# Patient Record
Sex: Female | Born: 1945 | Race: White | Hispanic: No | Smoking: Never smoker
Health system: Southern US, Community
[De-identification: ages and names within clinical notes are randomized; demographics above are authoritative.]

## PROBLEM LIST (undated history)

## (undated) DIAGNOSIS — I10 Essential (primary) hypertension: Secondary | ICD-10-CM

## (undated) HISTORY — PX: ABDOMINAL HYSTERECTOMY: SHX81

## (undated) HISTORY — DX: Essential (primary) hypertension: I10

## (undated) HISTORY — PX: HERNIA REPAIR: SHX51

## (undated) HISTORY — PX: PLEURAL SCARIFICATION: SHX748

---

## 2002-04-06 ENCOUNTER — Encounter: Admission: RE | Admit: 2002-04-06 | Discharge: 2002-04-06 | Payer: Self-pay | Admitting: Family Medicine

## 2002-04-06 ENCOUNTER — Encounter: Payer: Self-pay | Admitting: Family Medicine

## 2009-05-18 ENCOUNTER — Emergency Department (HOSPITAL_COMMUNITY): Admission: EM | Admit: 2009-05-18 | Discharge: 2009-05-18 | Payer: Self-pay | Admitting: Emergency Medicine

## 2009-05-19 ENCOUNTER — Inpatient Hospital Stay (HOSPITAL_COMMUNITY): Admission: EM | Admit: 2009-05-19 | Discharge: 2009-05-26 | Payer: Self-pay | Admitting: Emergency Medicine

## 2009-05-19 ENCOUNTER — Encounter (INDEPENDENT_AMBULATORY_CARE_PROVIDER_SITE_OTHER): Payer: Self-pay | Admitting: *Deleted

## 2009-05-19 IMAGING — CT CT CHEST W/ CM
1 of 2 series · 14 of 31 positions shown, 18 images · IV contrast (agent unspecified)
Comparison: Plain films earlier today

 CT CHEST

[DATE] - DUPLICATE COPY for exam association in [facility] – No change from original report.
CLINICAL DATA: Back pain with nausea and vomiting, elevated white
 count and elevated blood pressure.

 CT CHEST AND ABDOMEN WITH CONTRAST
TECHNIQUE: Multidetector CT imaging of the chest and abdomen was
 performed following the standard protocol during bolus
 administration of intravenous contrast.
 Contrast: [IN], 100 ml

[Series 2: ca with st · axial · 0.58mm/px · z∈[+834,+1214]mm · 14 of 86 slices shown, 18 images]
[im 5/86  mediastinal]
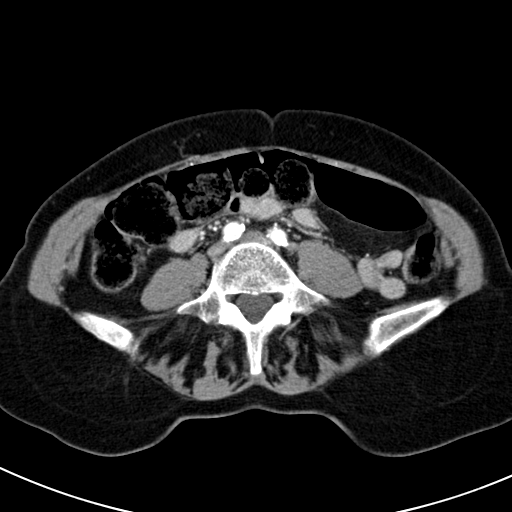
[im 5/86  lung]
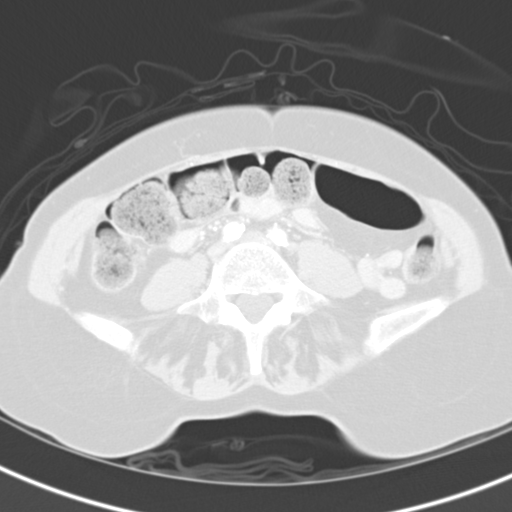
[im 14/86  lung]
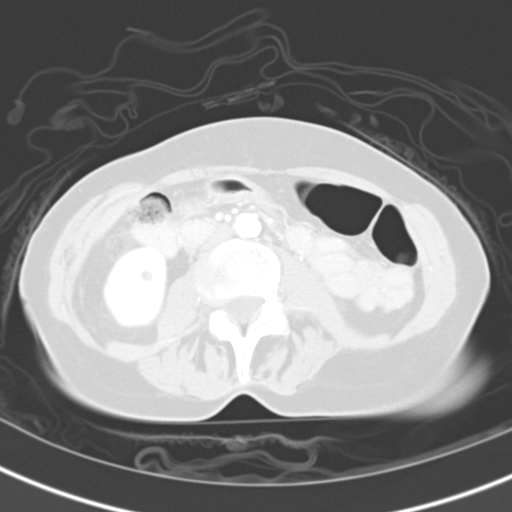
[im 18/86  lung]
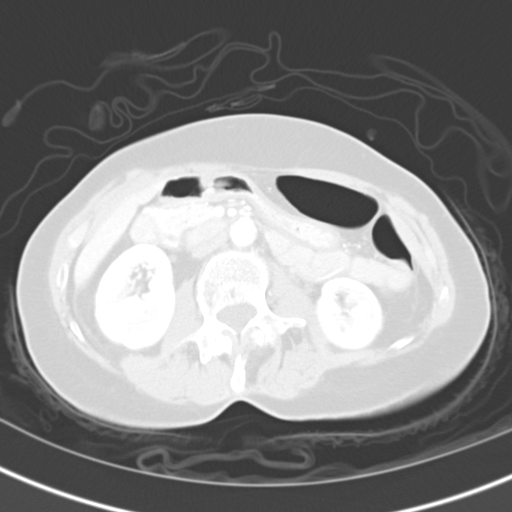
[im 27/86  lung]
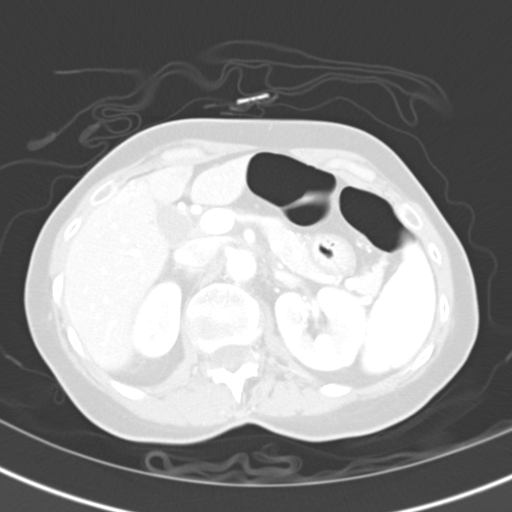
[im 29/86  mediastinal]
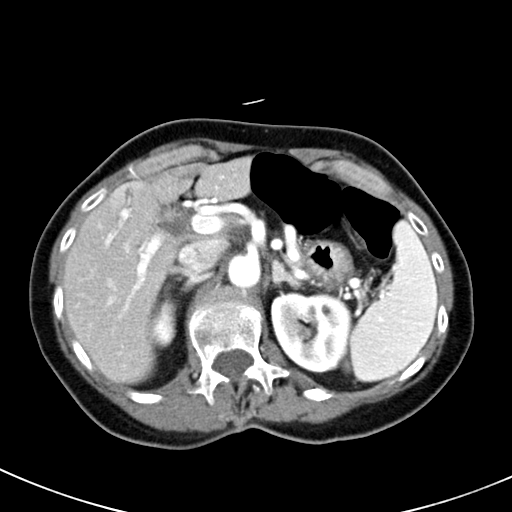
[im 29/86  lung]
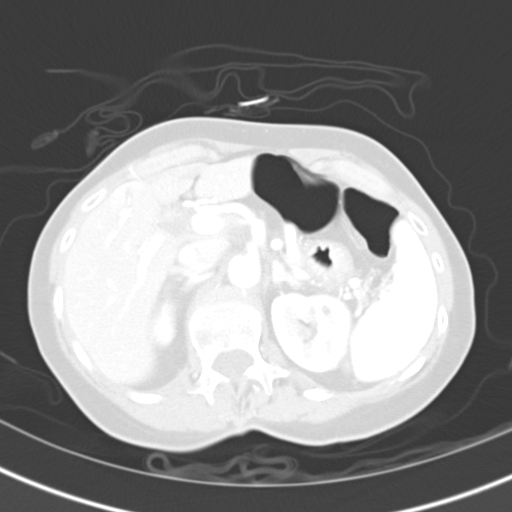
[im 36/86  lung]
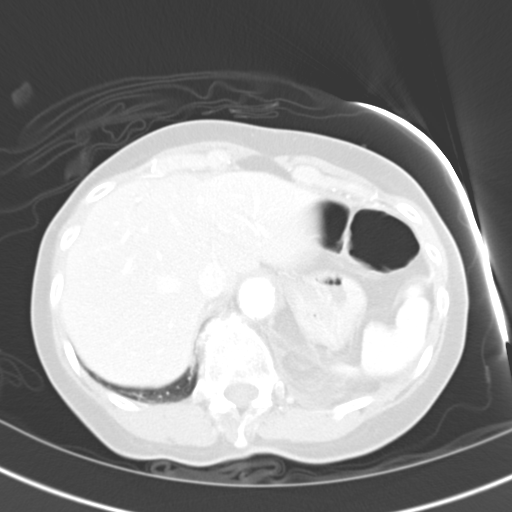
[im 41/86  lung]
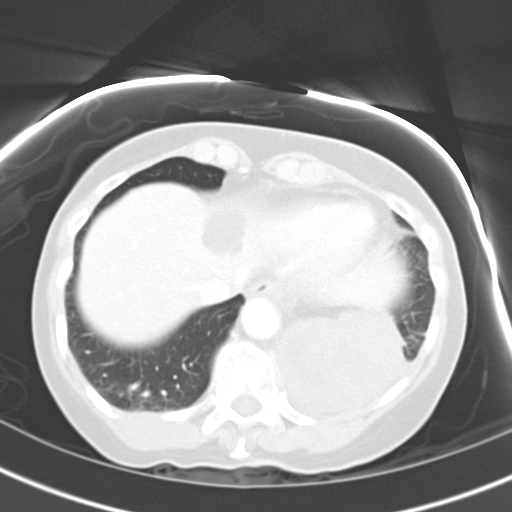
[im 45/86  lung]
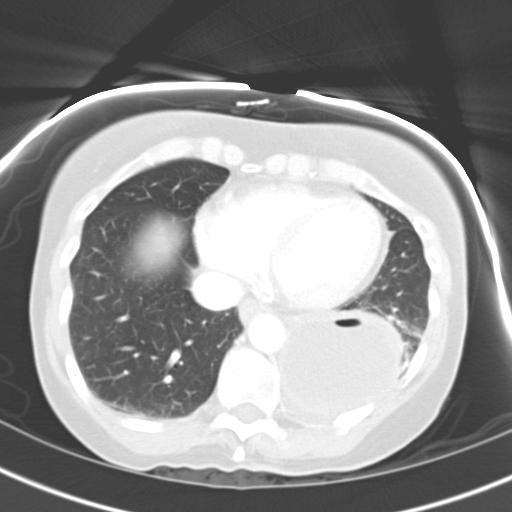
[im 50/86  mediastinal]
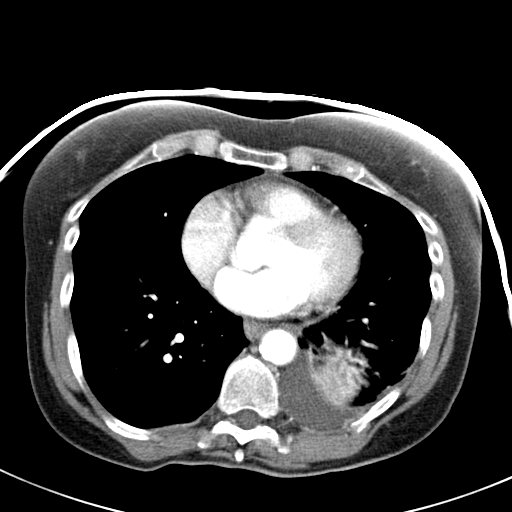
[im 50/86  lung]
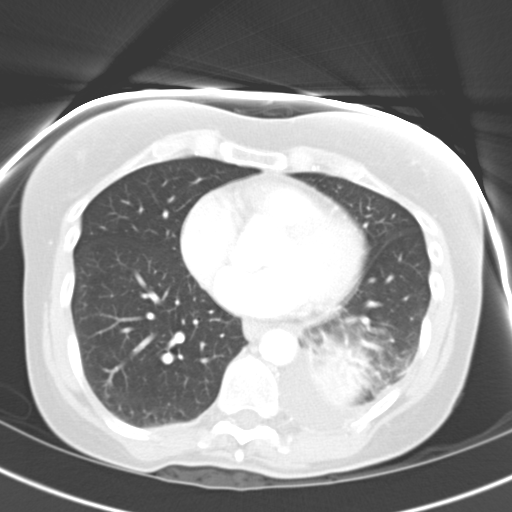
[im 57/86  lung]
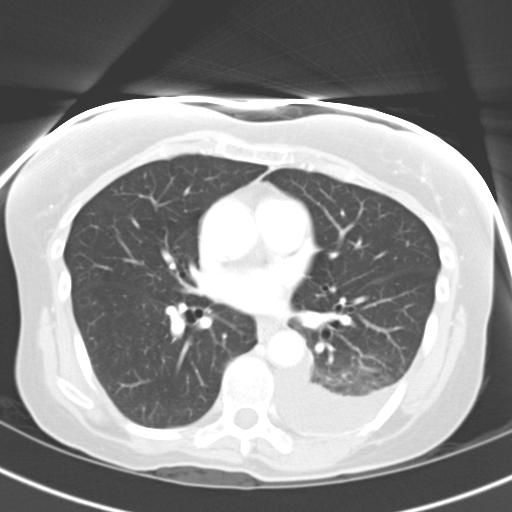
[im 59/86  lung]
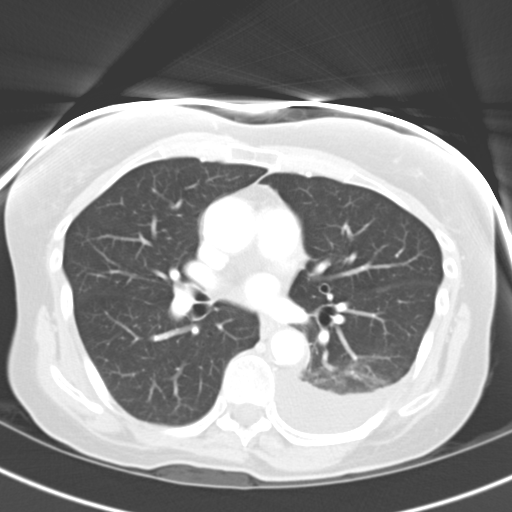
[im 68/86  lung]
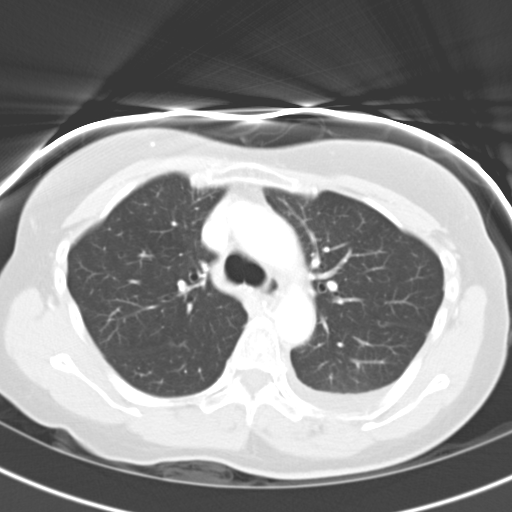
[im 72/86  mediastinal]
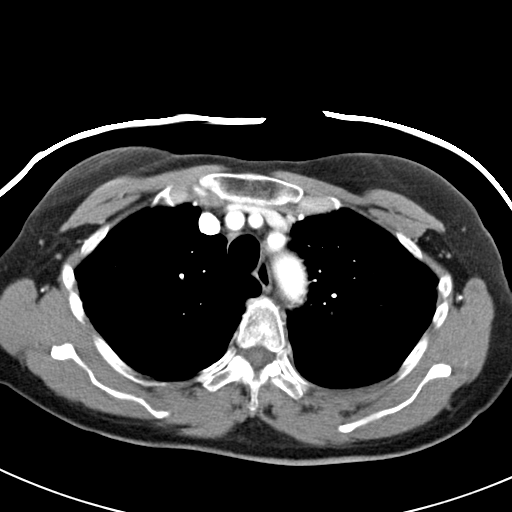
[im 72/86  lung]
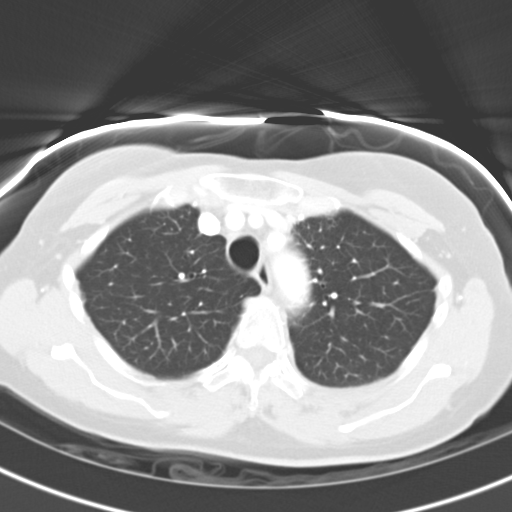
[im 81/86  lung]
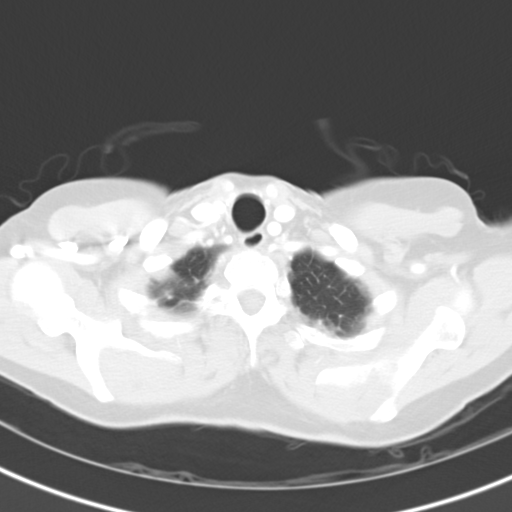

[14 of 31 positions shown; findings below may reference images not displayed]

FINDINGS: There is an the ovoid fluid and air containing structure
 in the chest which communicates through a small defect in the
 diaphragm with the infradiaphragmatic stomach. Cross-sectional
 measurements are 79 x 59 x 65 mm. I believe this is a
 diaphragmatic hernia containing stomach which is partially
 incarcerated. There is some thickening of this structure
 inferiorly, which could represent normal gastric wall thickening or
 could represent some edema related to incarceration. There is an
 associated left pleural effusion. There is no definite
 pneumoperitoneum or pneumomediastinum. I see no pneumatosis.

 Chest is otherwise clear. The vascular structures are within
 normal limits. There is some compressive atelectasis at the left
 base but no infiltrates, nodules, or failure. There is no
 pericardial effusion or right pleural effusion. No adenopathy is
 seen. Bony thorax is unremarkable except for a small calcified
 thoracic disc protrusion in the mid-thoracic region.
IMPRESSION: Suspect diaphragmatic hernia containing the fundal stomach which
 has herniated into the left chest. There is associated left
 pleural effusion which may be sympathetic.

 CT ABDOMEN
FINDINGS: The fluid filled structure in the left lower thorax can
 be seen to communicate through a small defect in the diaphragm with
 the stomach below. This is best seen on coronal images 47-50. The
 gastroesophageal junction appears to remain below the diaphragm.
 The herniated stomach appears to represent the fundus, and may be
 partially incarcerated. There is stranding of the fat medial to
 the spleen suggesting an inflammatory reaction. I do not see any
 esophageal tear or perforation. There is no pneumoperitoneum or
 pneumomediastinum.

 The remainder of the abdominal organs appear unremarkable. The
 liver is normal except for a medial left lobe cyst roughly
 spherical measuring 2.5 cm. The spleen is normal. No pancreatic,
 renal, or adrenal abnormalities are seen. Vascular structures are
 patent with new mild atherosclerotic calcification of the aorta.
 Mild degenerative change lower thoracic lumbar spine. No small or
 large bowel abnormality can be seen.
IMPRESSION: Left diaphragmatic hernia with suspected partially incarcerated
 gastric fundus.

## 2009-05-19 IMAGING — CR DG ABDOMEN DECUB ONLY 1V
2 series · 2 of 2 positions shown · non-contrast
Comparison: Plain films earlier today.

CLINICAL DATA: Vomiting with elevated blood pressure.  Abnormal
acute abdomen and chest series.

ABDOMEN - 1 VIEW DECUBITUS

[w abdomen decub (1 of 2)]
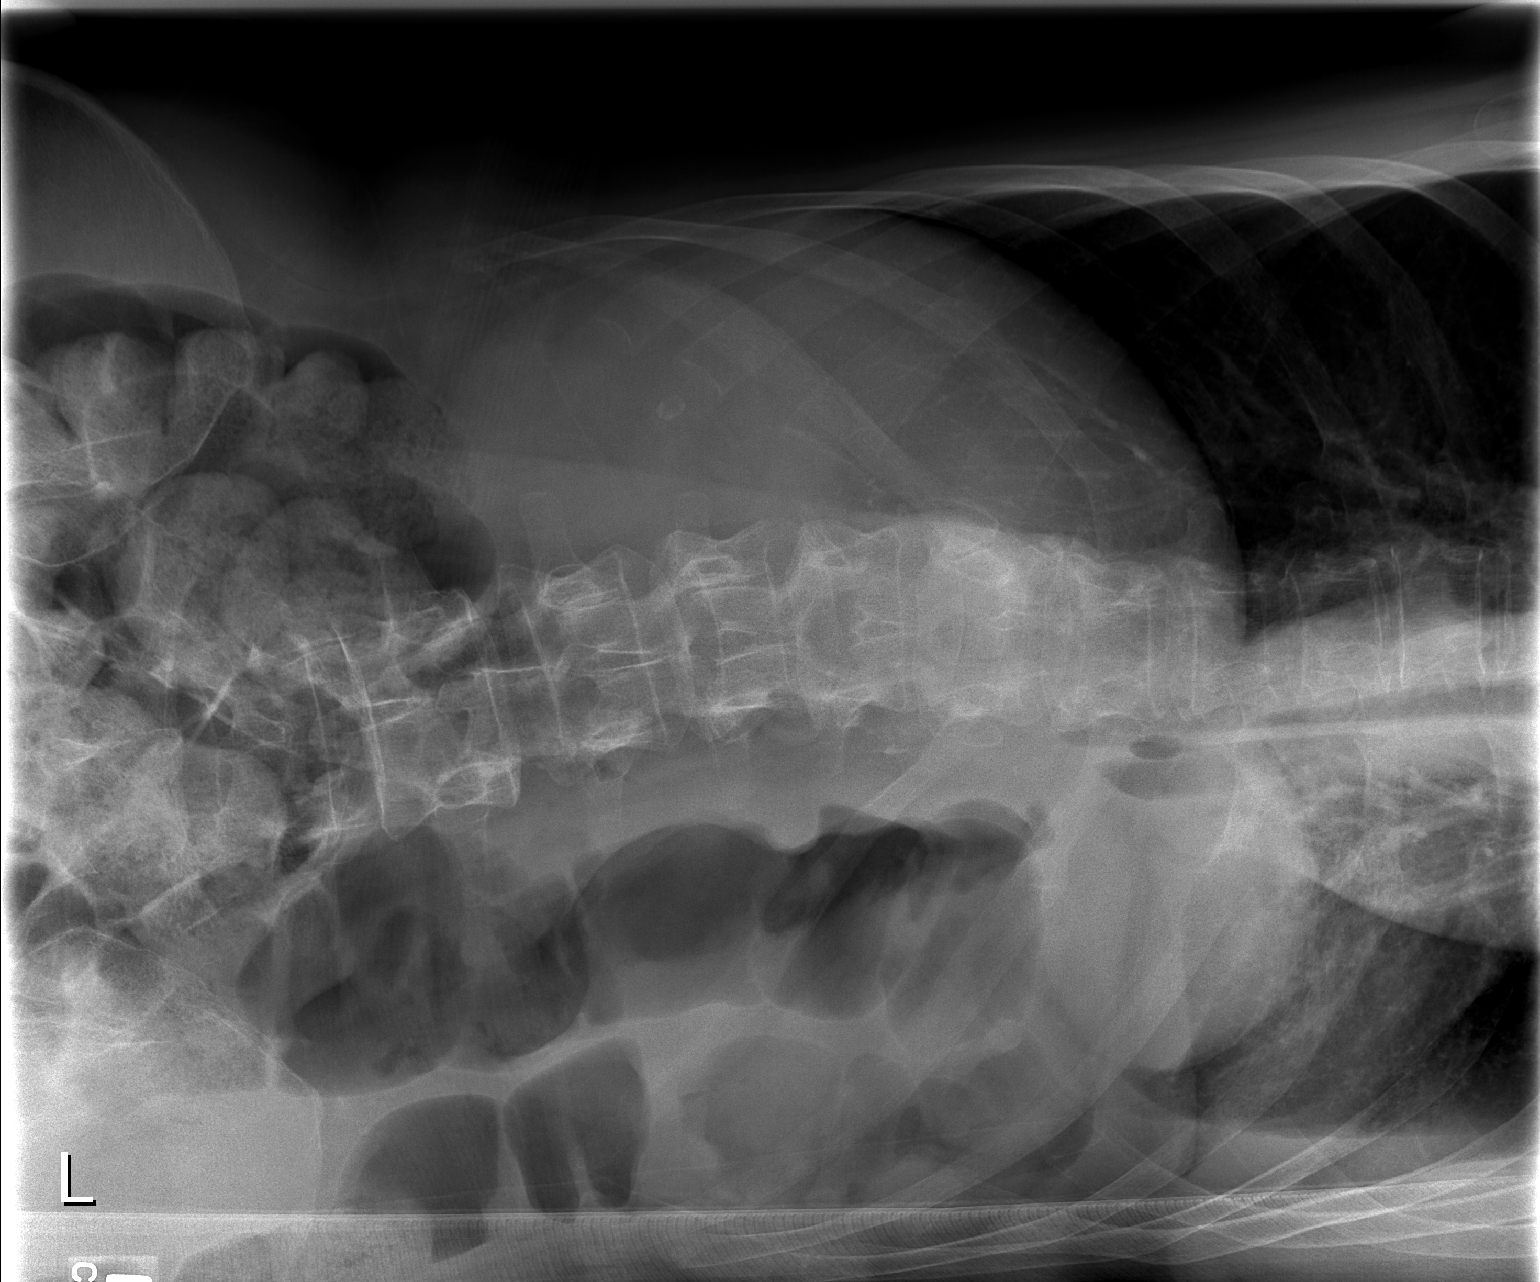

[w abdomen decub (2 of 2)]
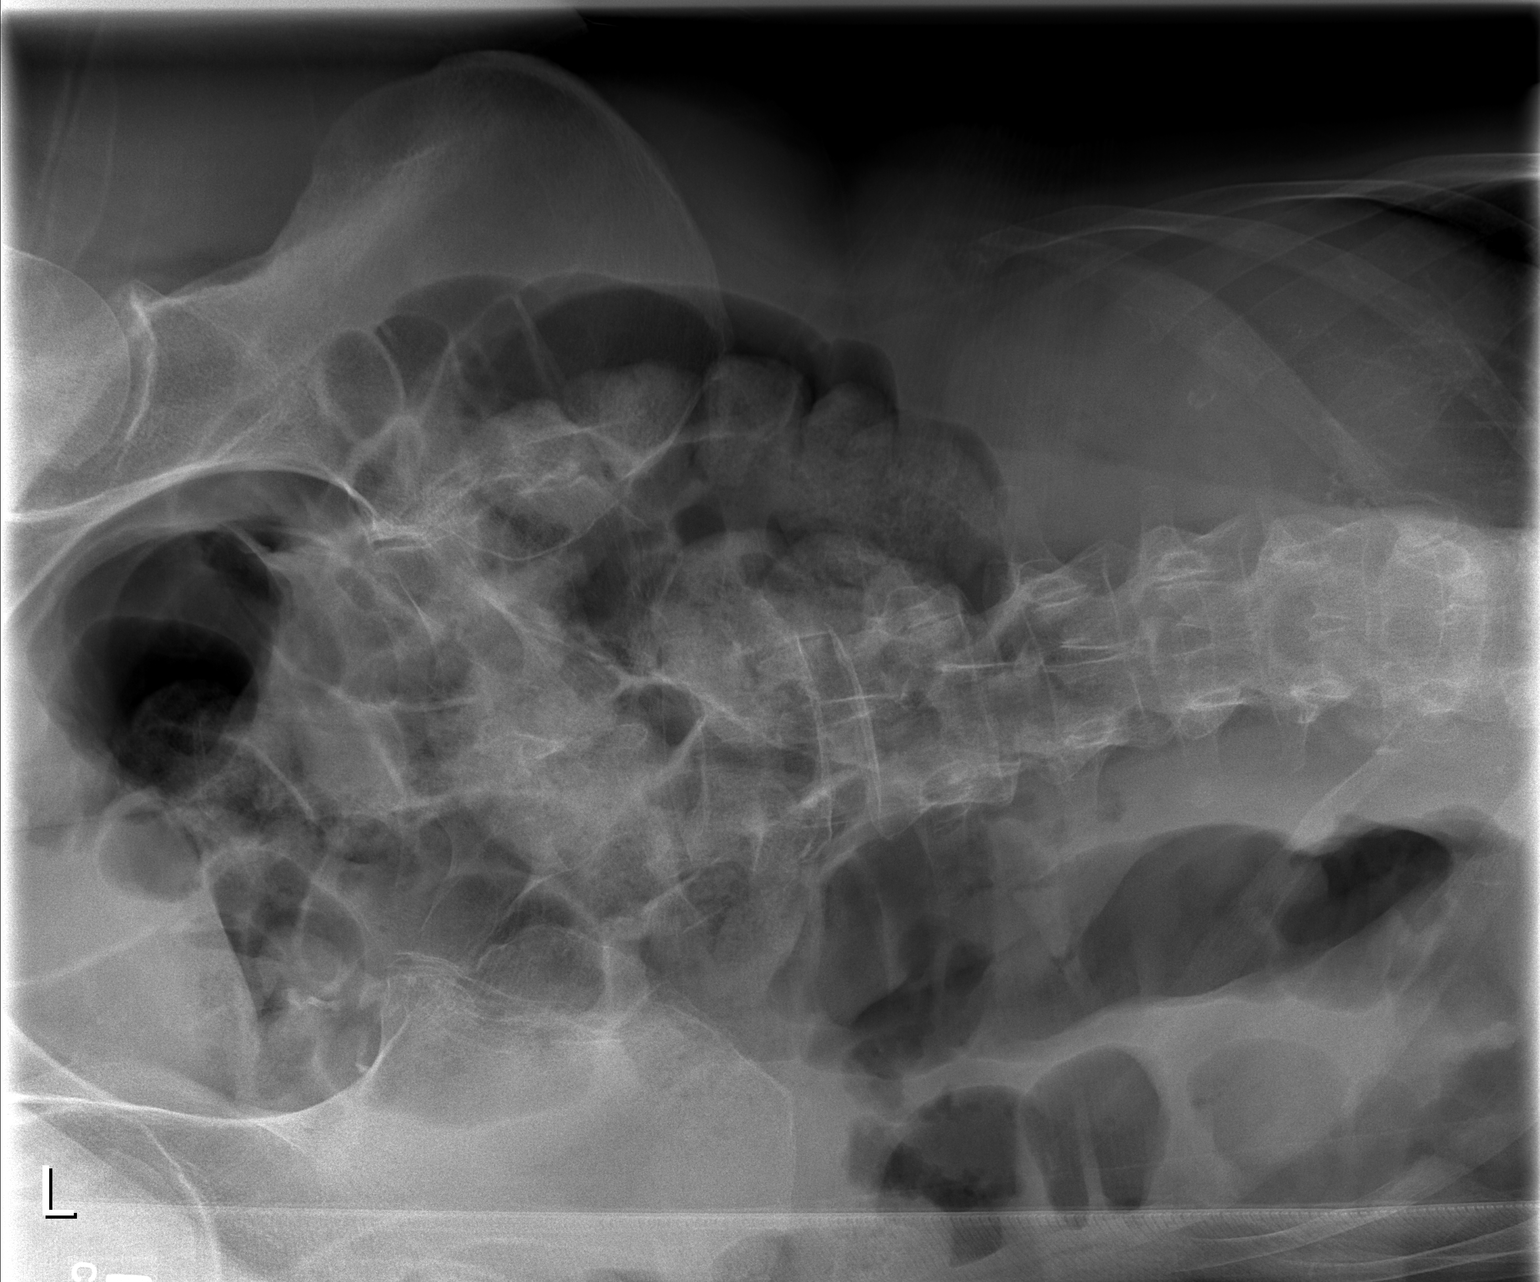

[2 of 2 positions shown; findings below may reference images not displayed]

FINDINGS: With the patient in the left lateral decubitus position,
a sizable left pleural effusion becomes evident.  There is mild
atelectasis at the left base.  The linear air collection under the
left hemidiaphragm now extends medially and is trapped tip near the
gastroesophageal junction. It is not clear whether this represents
pneumoperitoneum or pneumomediastinum.  With the history of
vomiting, concern is raised for distal esophageal tear.  CT of the
chest and abdomen is recommended for further evaluation.  Findings
discussed with Dr. HASKELL.
IMPRESSION: Concern raised for distal esophageal tear related to vomiting.  CT
chest and abdomen recommended for further evaluation.

## 2009-05-19 IMAGING — CR DG ABDOMEN ACUTE W/ 1V CHEST
3 series · 3 of 3 positions shown · non-contrast
Comparison: None

CLINICAL DATA: Nausea and vomiting.  Weakness.  Hypertension.

ACUTE ABDOMEN SERIES (ABDOMEN 2 VIEW & CHEST 1 VIEW)

[w chest pa]
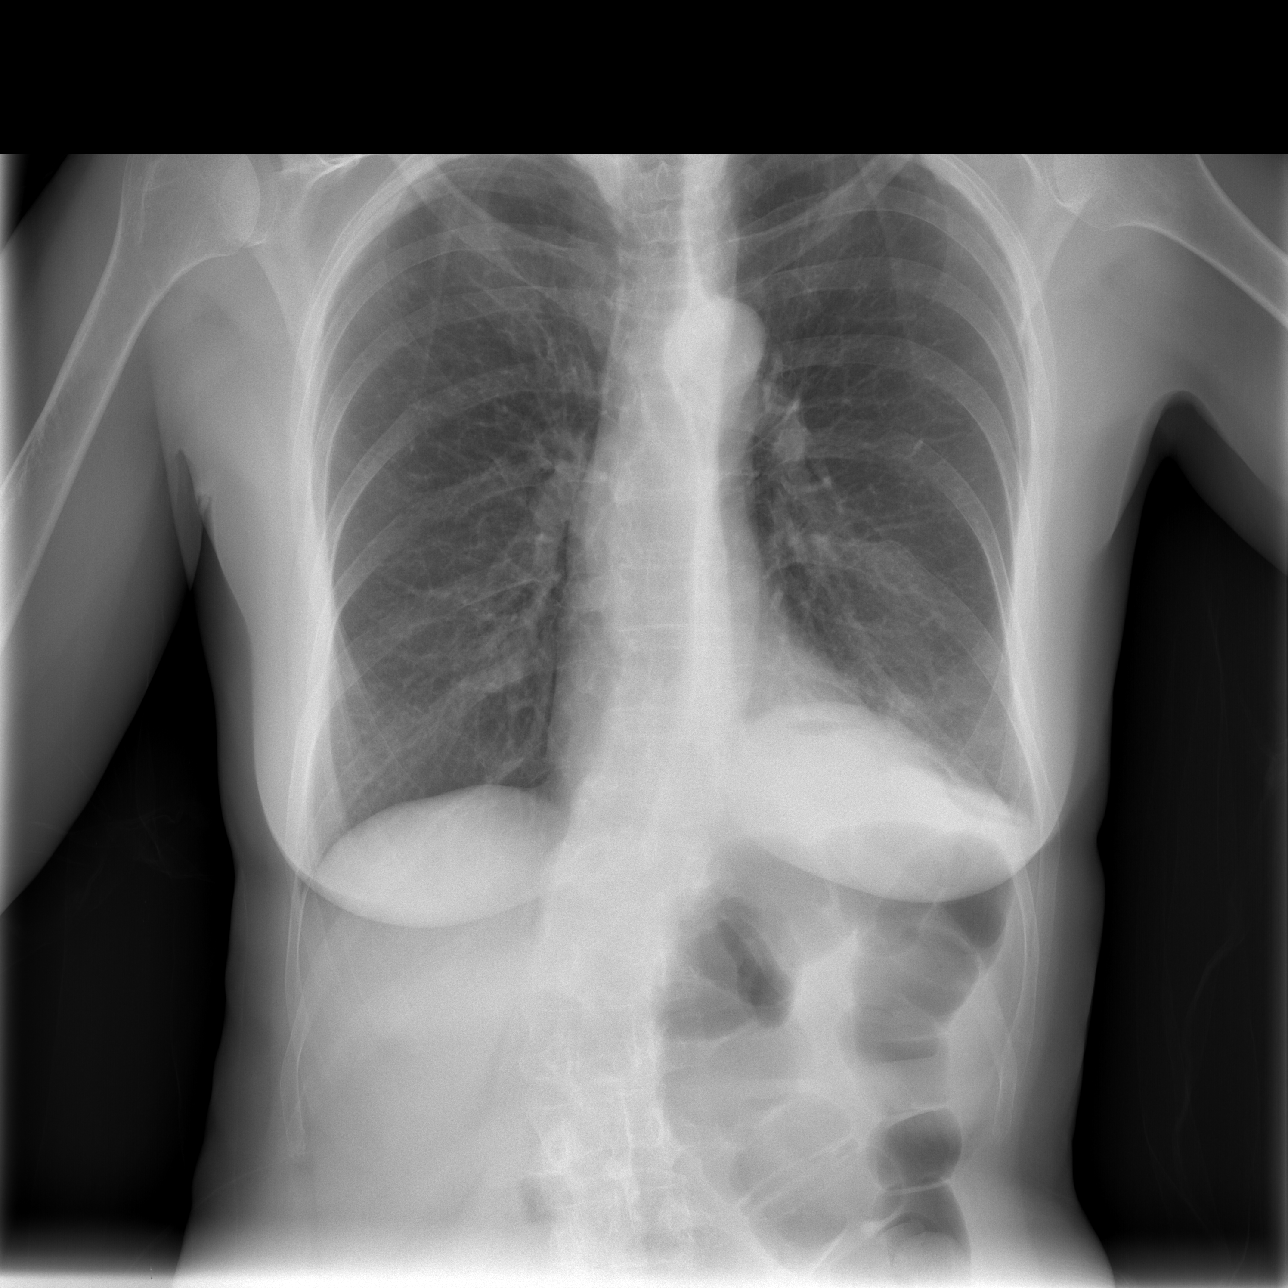

[w abdomen upright *]
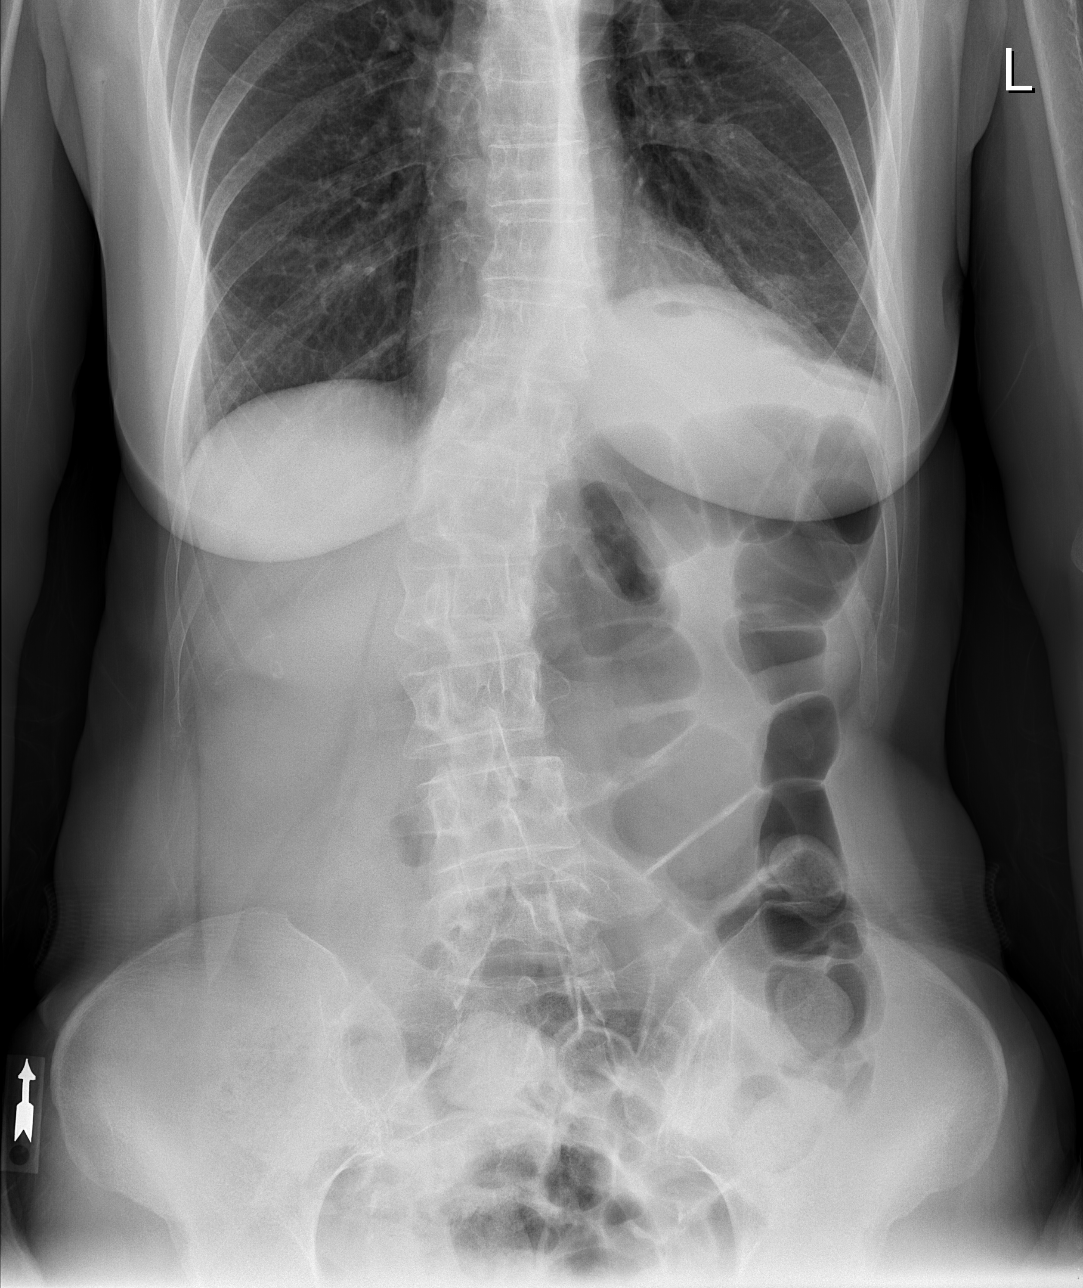

[t abdomen supine]
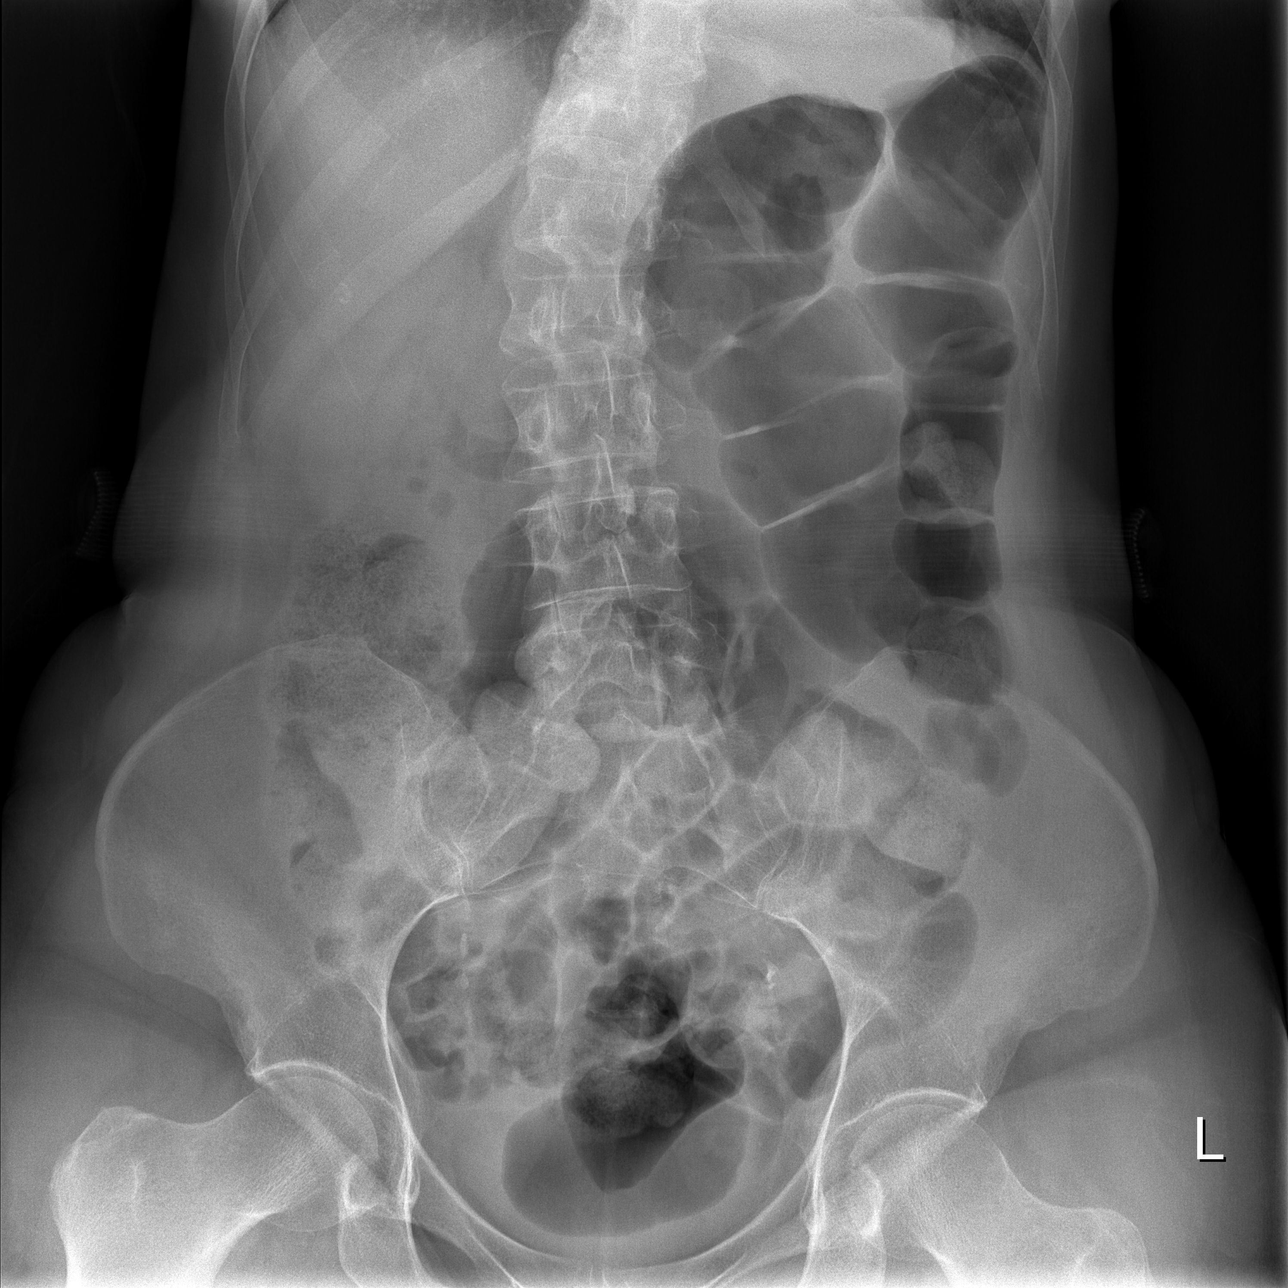

[3 of 3 positions shown; findings below may reference images not displayed]

FINDINGS: There is a linear air collection under the slightly
elevated left hemidiaphragm.  This is probably in the fundus of the
stomach but I recommend a left lateral decubitus view of the
abdomen to exclude free air.

There is slight irregular apical pleural thickening on the left.

The lungs are otherwise clear and heart size and vascularity are
normal.

The bowel gas pattern is normal.  There is a thoracolumbar
scoliosis.
IMPRESSION: 1.  Linear air collection under the left hemidiaphragm.  I
recommend left lateral decubitus view to exclude free air in the
abdomen.
2.  No other significant findings.

## 2009-05-19 IMAGING — CT CT HEAD W/O CM
1 series · 16 of 30 positions shown, 20 images · non-contrast
Comparison: None

CLINICAL DATA: Vomiting.  Weakness.  Hypertension.

CT HEAD WITHOUT CONTRAST
TECHNIQUE: Contiguous axial images were obtained from the base of
the skull through the vertex without contrast.

[Series 2: head_seq 4.5 h37s st · axial · 0.43mm/px · z∈[-165,-21]mm · 16 of 36 slices shown, 20 images]
[im 2/36  brain]
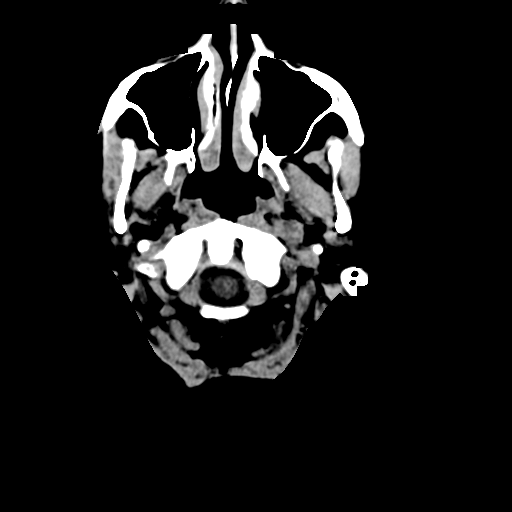
[im 2/36  bone]
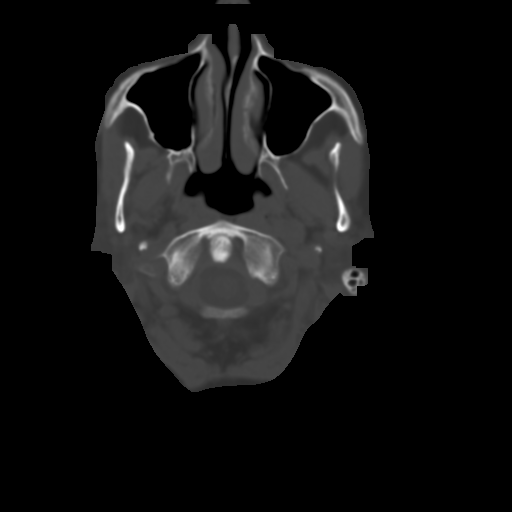
[im 4/36  brain]
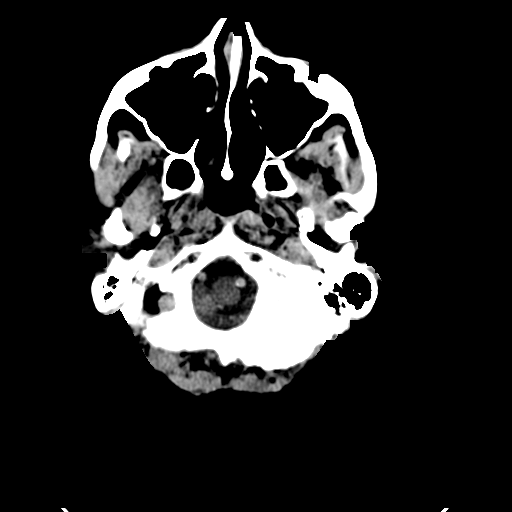
[im 7/36  brain]
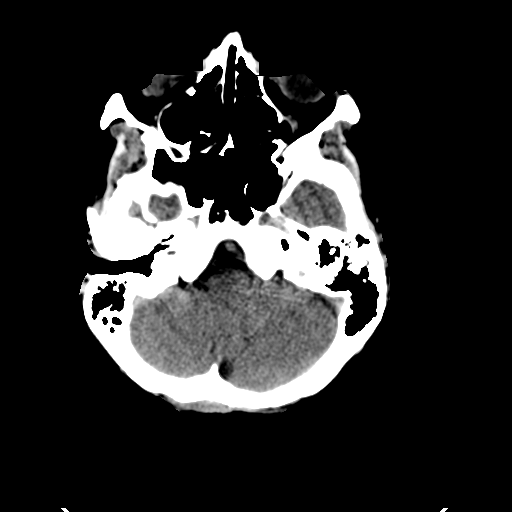
[im 9/36  brain]
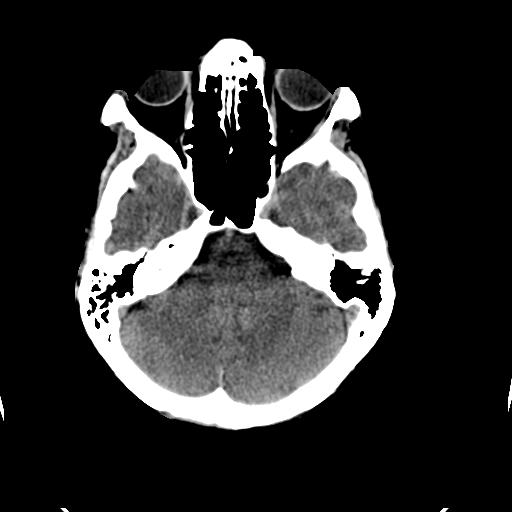
[im 10/36  brain]
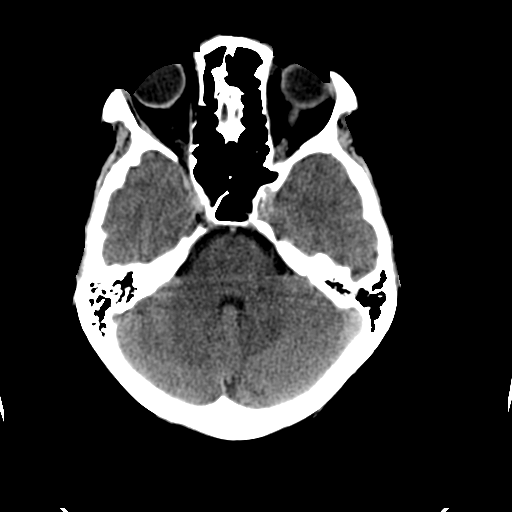
[im 10/36  bone]
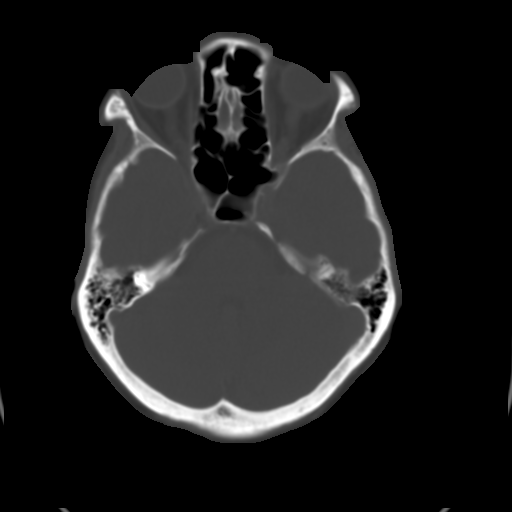
[im 13/36  brain]
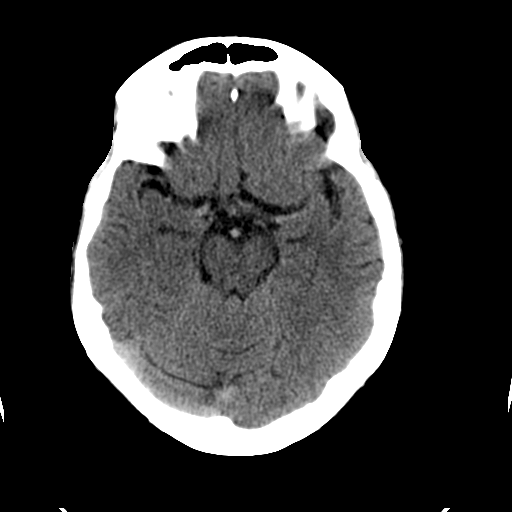
[im 15/36  brain]
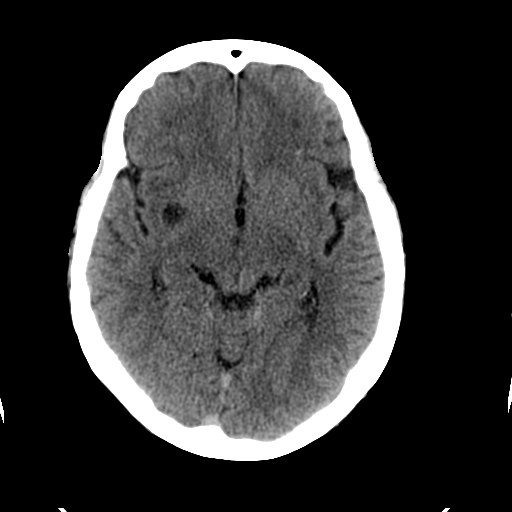
[im 17/36  brain]
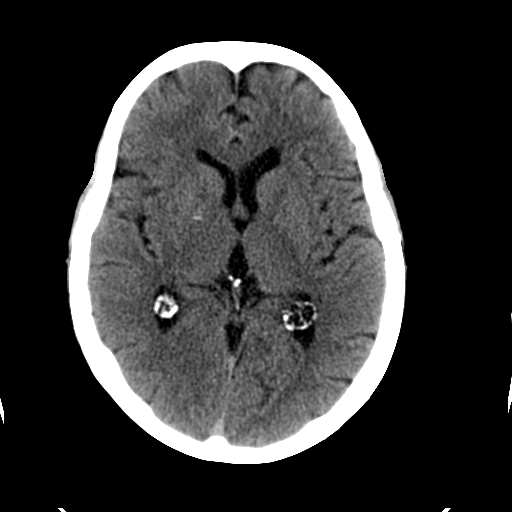
[im 19/36  brain]
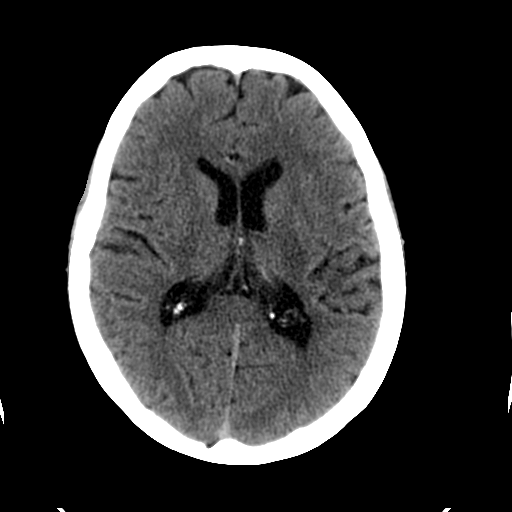
[im 19/36  bone]
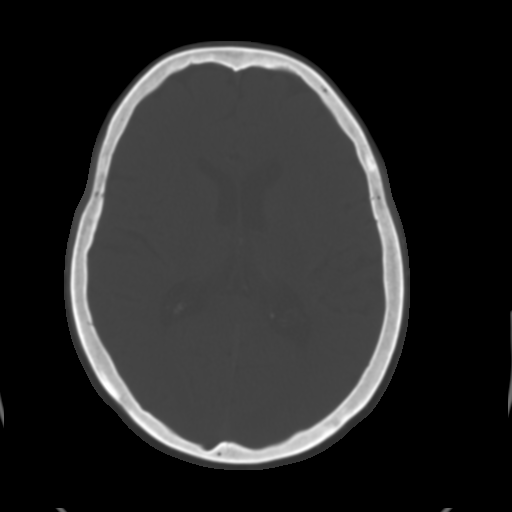
[im 21/36  brain]
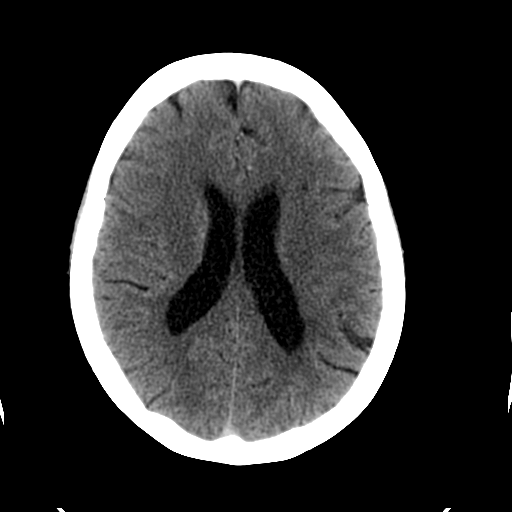
[im 23/36  brain]
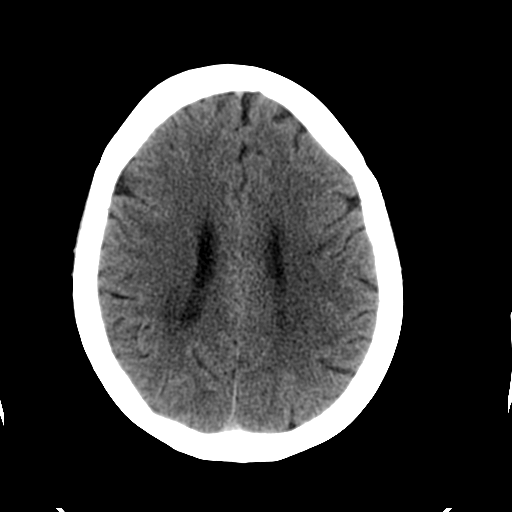
[im 26/36  brain]
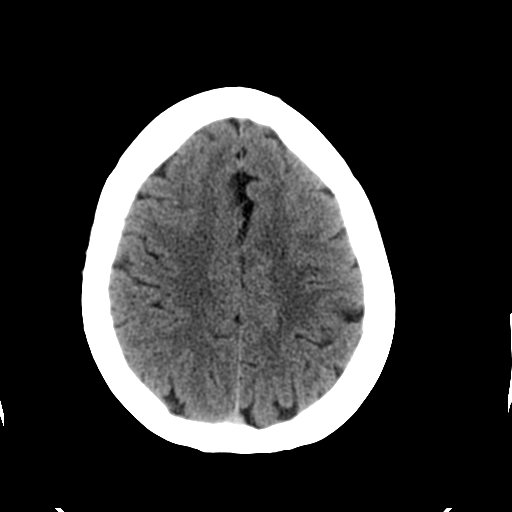
[im 27/36  brain]
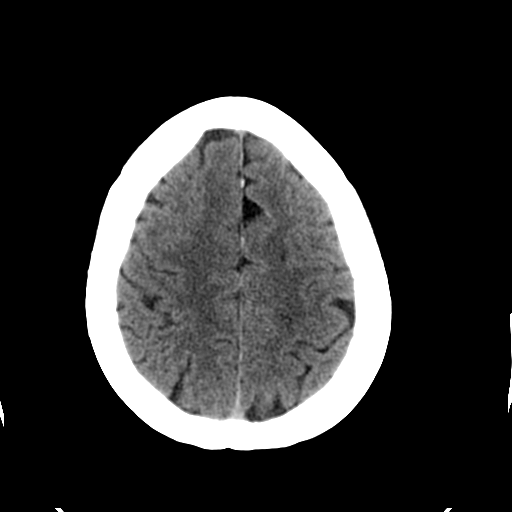
[im 27/36  bone]
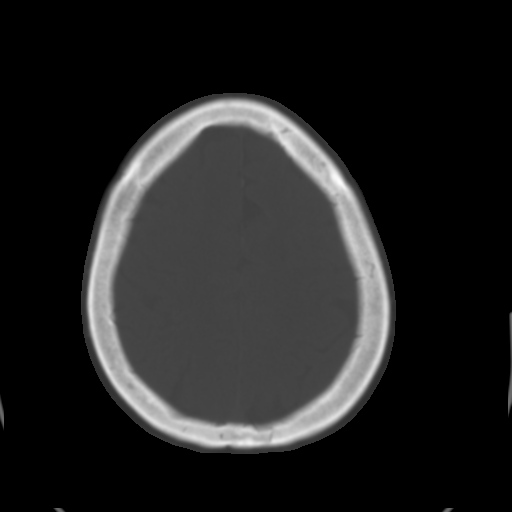
[im 29/36  brain]
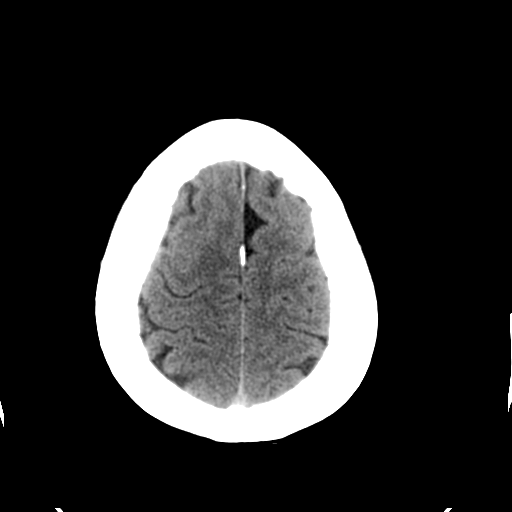
[im 32/36  brain]
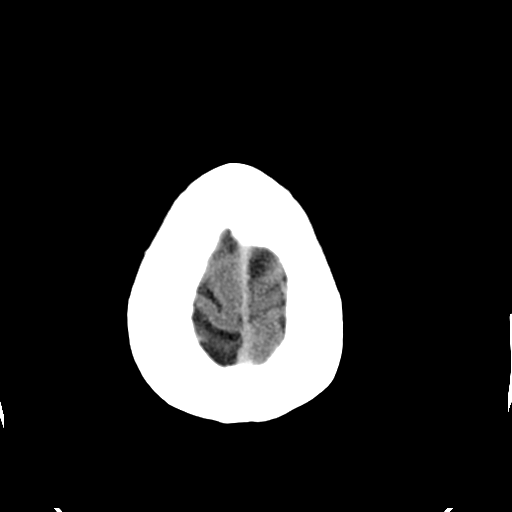
[im 34/36  brain]
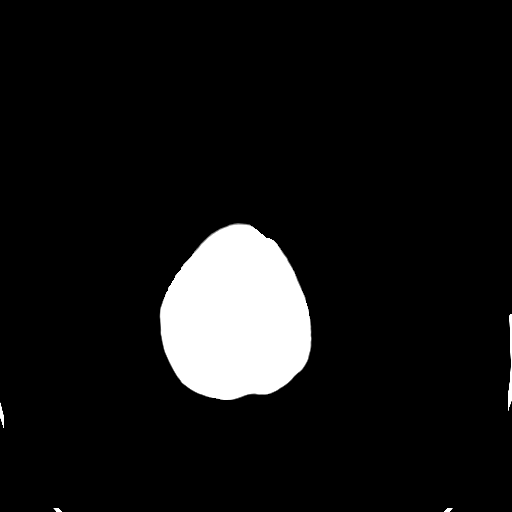

[16 of 30 positions shown; findings below may reference images not displayed]

FINDINGS: There is no evidence of intracranial hemorrhage, brain
edema or other signs of acute infarction.  There is no evidence of
intracranial mass lesion or mass effect.  No abnormal extra-axial
fluid collections are identified.

Ventricles normal in size.  Mild chronic small vessel disease
noted.  Enlarged perivascular space noted and right basal ganglia
region.  No skull abnormality identified.
IMPRESSION: 1.  No acute intracranial abnormality.
2.  Mild chronic small vessel disease.

## 2009-05-20 ENCOUNTER — Encounter (INDEPENDENT_AMBULATORY_CARE_PROVIDER_SITE_OTHER): Payer: Self-pay | Admitting: *Deleted

## 2009-05-20 ENCOUNTER — Encounter (INDEPENDENT_AMBULATORY_CARE_PROVIDER_SITE_OTHER): Payer: Self-pay | Admitting: Internal Medicine

## 2009-05-20 IMAGING — CR DG CHEST 1V PORT
1 series · 1 of 1 positions shown · non-contrast
Comparison: Chest CT [DATE].

CLINICAL DATA: Left chest tube.  Pain.

PORTABLE CHEST - 1 VIEW

[view not recorded]
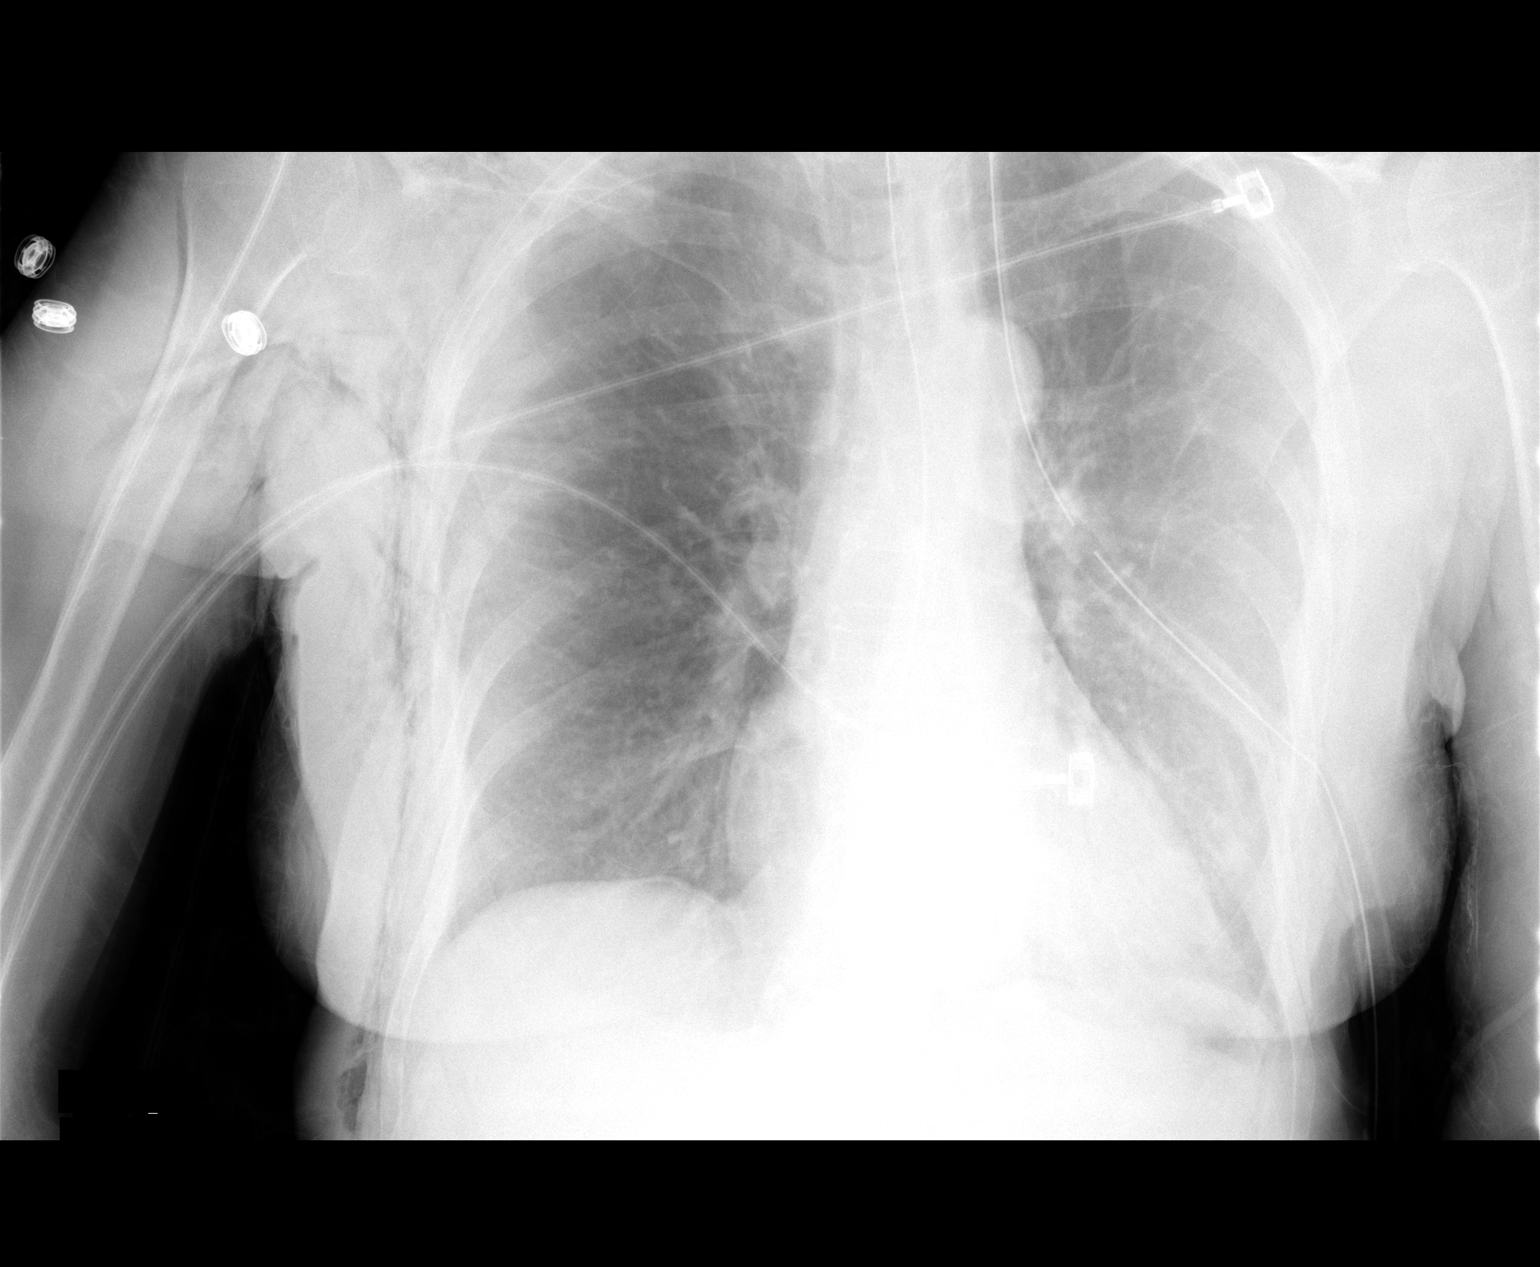

[1 of 1 positions shown; findings below may reference images not displayed]

FINDINGS: The patient has a left chest tube in place.  Tiny left
apical pneumothorax is seen.  There is some patchy left basilar
atelectasis.  Right lung is clear.  Subcutaneous air along the
right chest wall noted.  Heart size normal.
IMPRESSION: 1.  Left chest tube in place with tiny left apical pneumothorax.
2.  Patchy left basilar atelectasis.

## 2009-05-21 IMAGING — CR DG CHEST 1V PORT
1 series · 1 of 1 positions shown · non-contrast
Comparison: Chest [DATE].

CLINICAL DATA: Shortness of breath.  Status post abdominal surgery.

PORTABLE CHEST - 1 VIEW

[view not recorded]
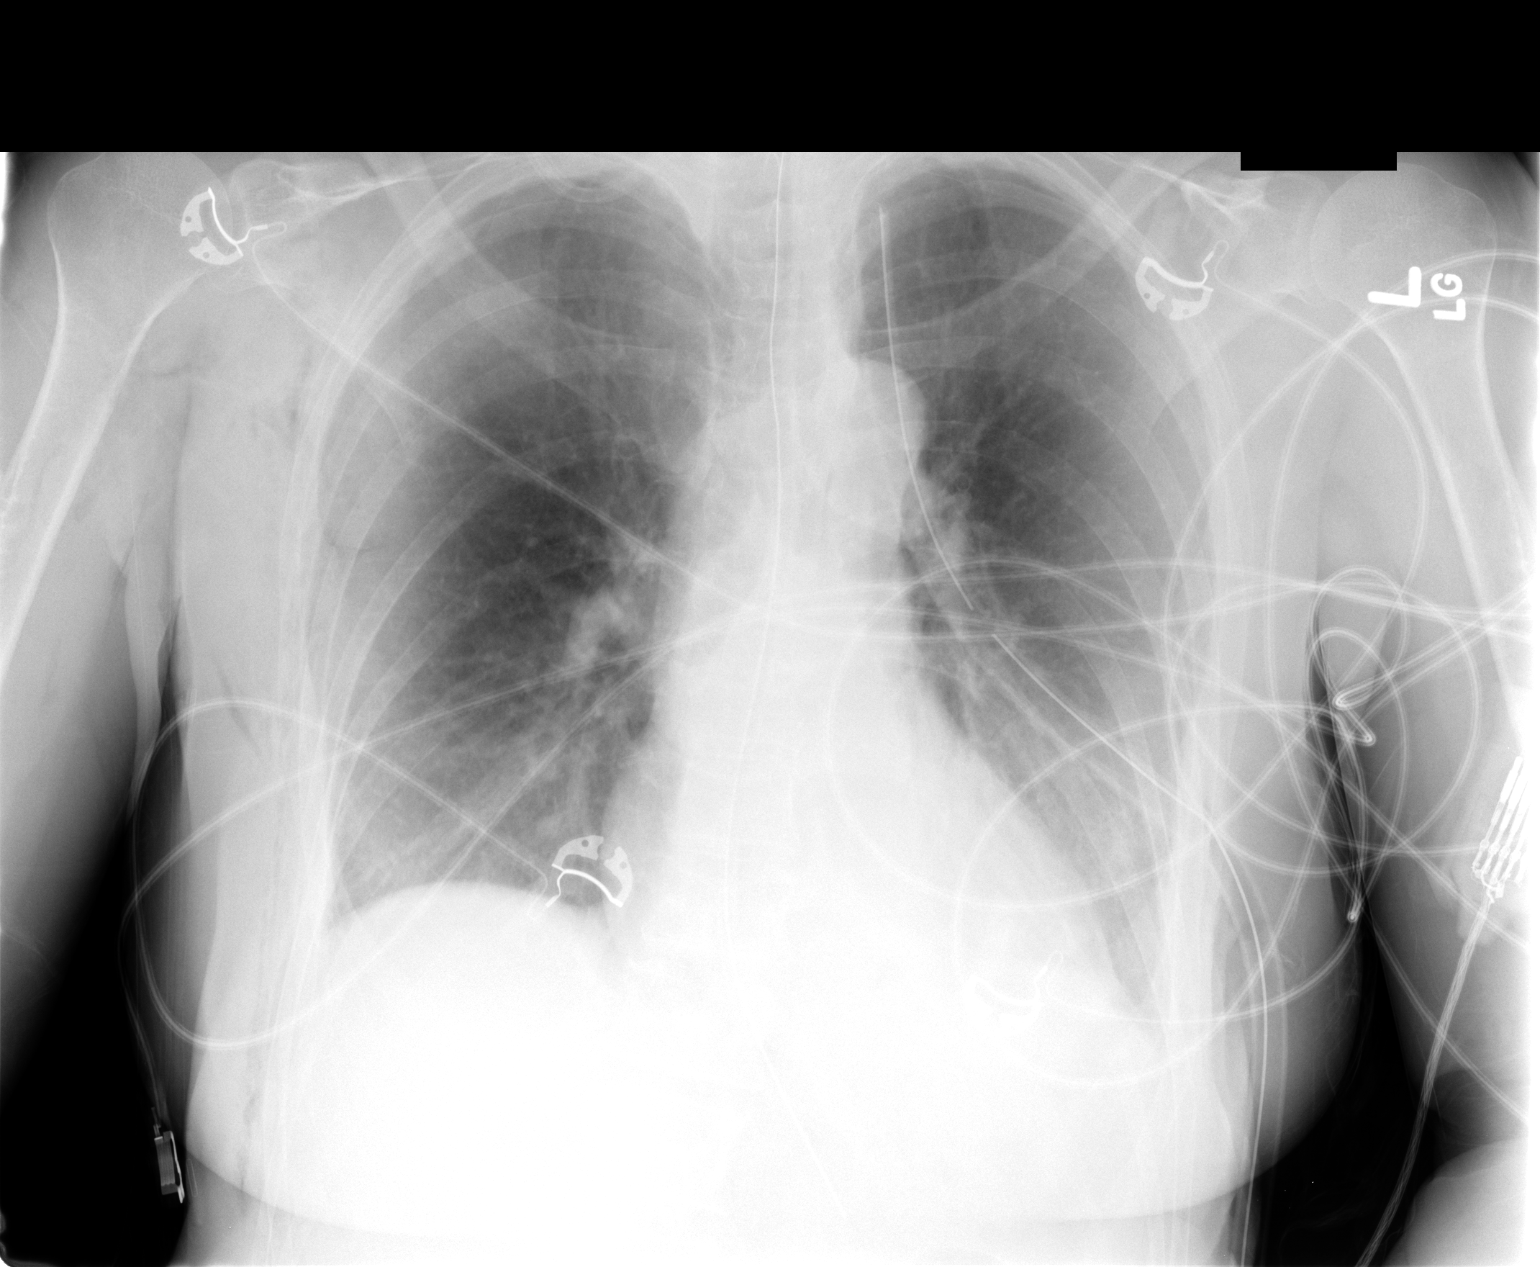

[1 of 1 positions shown; findings below may reference images not displayed]

FINDINGS: Left chest tube remains in place.  No pneumothorax.
There is mild left basilar atelectasis and a small effusion.  Right
lung is clear.  Heart size normal.  Subcutaneous air in the right
chest has decreased.  NG tube again noted.
IMPRESSION: 1.  No pneumothorax the left chest tube in place.
2.  Left basilar atelectasis with a small effusion.

## 2009-05-22 ENCOUNTER — Encounter (INDEPENDENT_AMBULATORY_CARE_PROVIDER_SITE_OTHER): Payer: Self-pay | Admitting: *Deleted

## 2009-05-22 IMAGING — RF DG ESOPHAGUS
15 series · 15 of 15 positions shown · non-contrast
Comparison: None.

CLINICAL DATA: Rule out leak after partial gastrectomy.

ESOPHOGRAM/BARIUM SWALLOW
TECHNIQUE: Single contrast examination was performed using water-
soluble contrast.
Fluoroscopy time:  1.2 minutes.

[Series 1: run · 1 of 1 slices shown (1 of 15)]
[im 1/1]
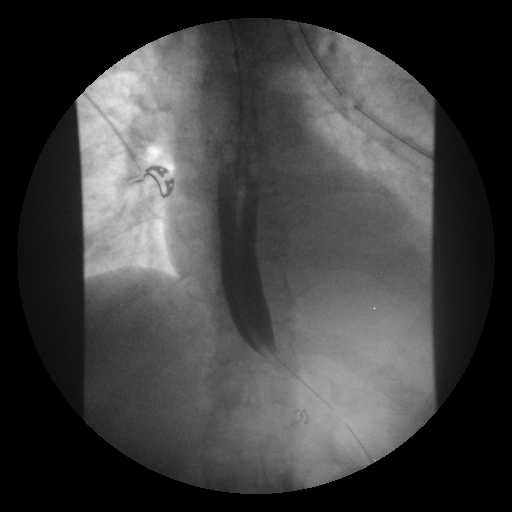

[Series 2: run · 1 of 1 slices shown (2 of 15)]
[im 1/1]
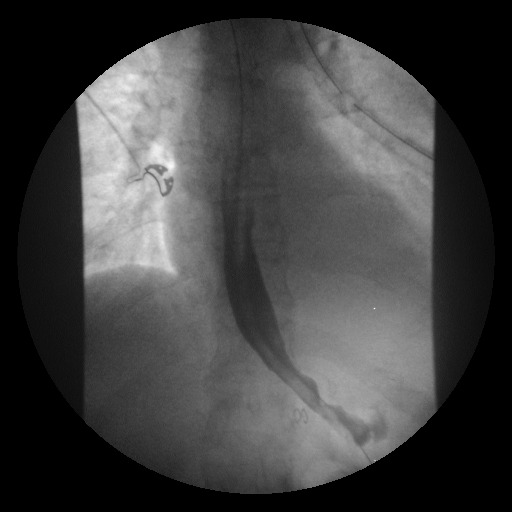

[Series 3: run · 1 of 1 slices shown (3 of 15)]
[im 1/1]
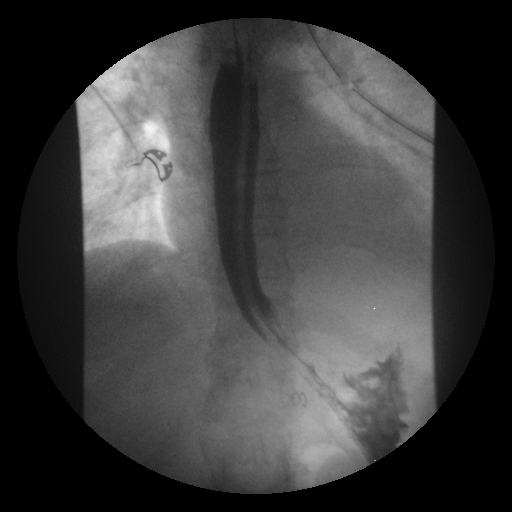

[Series 4: run · 1 of 1 slices shown (4 of 15)]
[im 1/1]
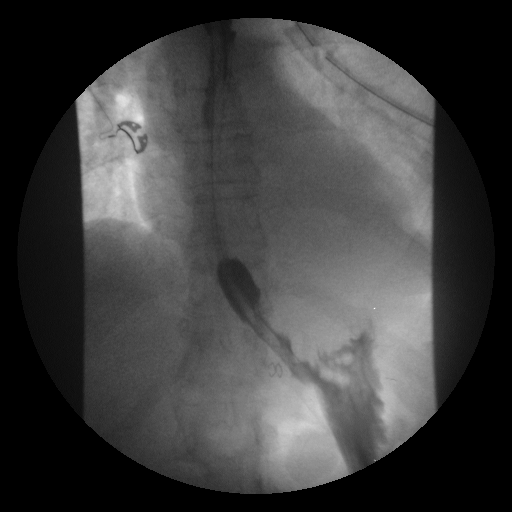

[Series 5: run · 1 of 1 slices shown (5 of 15)]
[im 1/1]
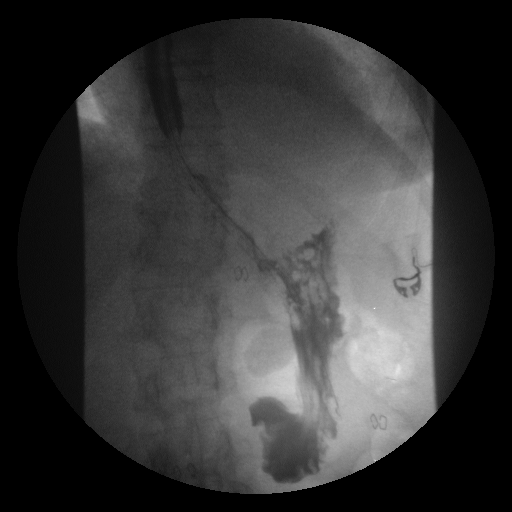

[Series 6: run · 1 of 1 slices shown (6 of 15)]
[im 1/1]
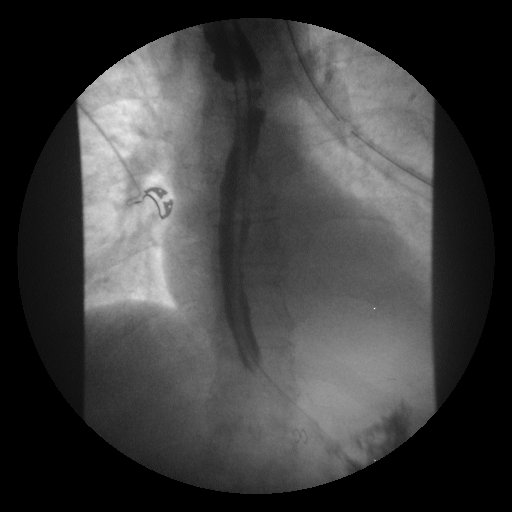

[Series 7: run · 1 of 1 slices shown (7 of 15)]
[im 1/1]
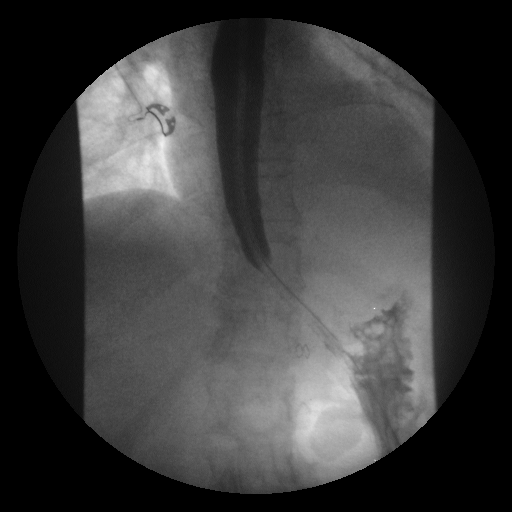

[Series 8: run · 1 of 1 slices shown (8 of 15)]
[im 1/1]
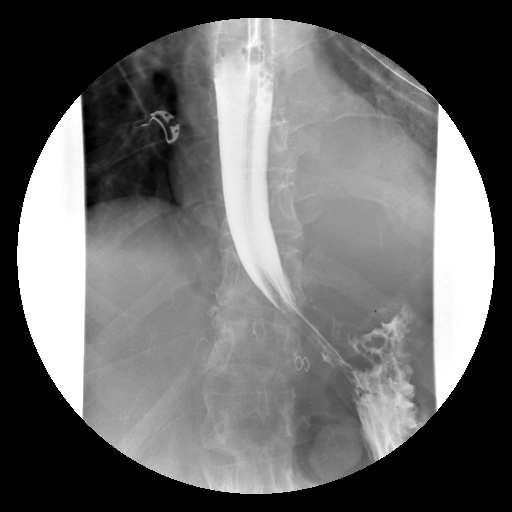

[Series 9: run · 1 of 1 slices shown (9 of 15)]
[im 1/1]
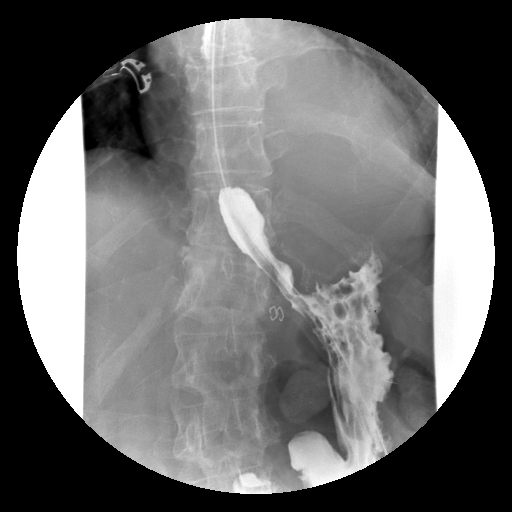

[Series 10: run · 1 of 1 slices shown (10 of 15)]
[im 1/1]
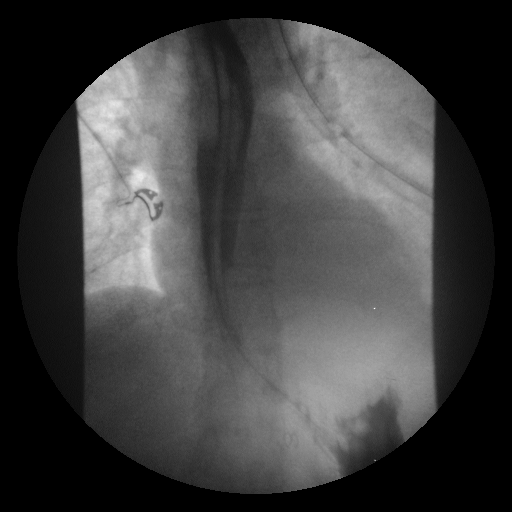

[Series 11: run · 1 of 1 slices shown (11 of 15)]
[im 1/1]
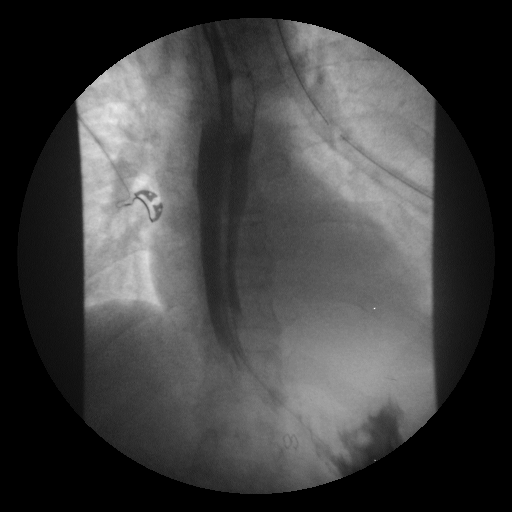

[Series 12: run · 1 of 1 slices shown (12 of 15)]
[im 1/1]
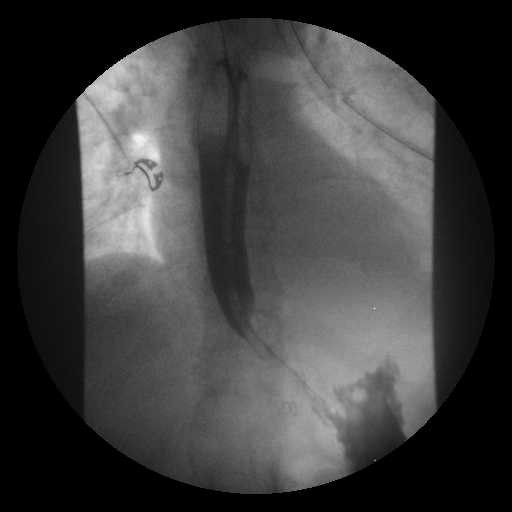

[Series 13: run · 1 of 1 slices shown (13 of 15)]
[im 1/1]
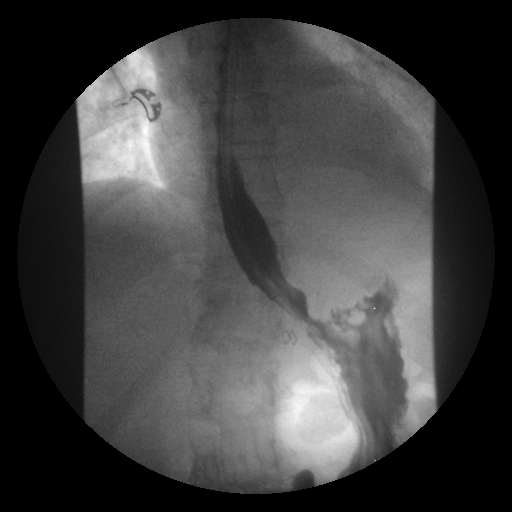

[Series 14: run · 1 of 1 slices shown (14 of 15)]
[im 1/1]
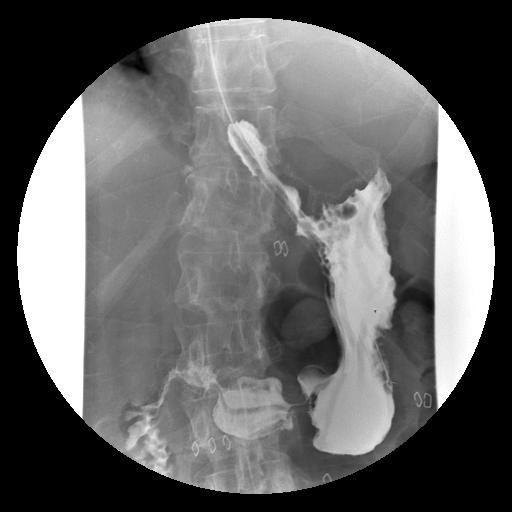

[Series 15: run · 1 of 1 slices shown (15 of 15)]
[im 1/1]
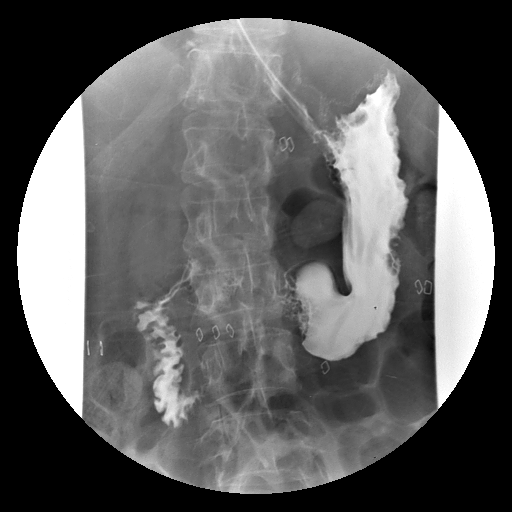

[15 of 15 positions shown; findings below may reference images not displayed]

FINDINGS: The patient ingested 50 ml of [ZV].  This shows
mild irregularity in the distal esophagus, likely postoperative
edema.  No evidence of obstruction.  No evidence of leak.
Visualized remaining stomach unremarkable.  NG tube is present in
the body of the stomach.  Visualized proximal duodenum normal.
IMPRESSION: Mild irregularity in the postoperative region in the distal
esophagus/proximal stomach region.  This likely reflects mild
postoperative edema.  No evidence of obstruction or leak.

## 2009-05-24 IMAGING — CR DG CHEST 1V PORT
1 series · 1 of 1 positions shown · non-contrast
Comparison: Portable exam [3T] hours compared to [DATE]

CLINICAL DATA: Nausea, vomiting, shortness of breath, partial
gastrectomy

PORTABLE CHEST - 1 VIEW

[view not recorded]
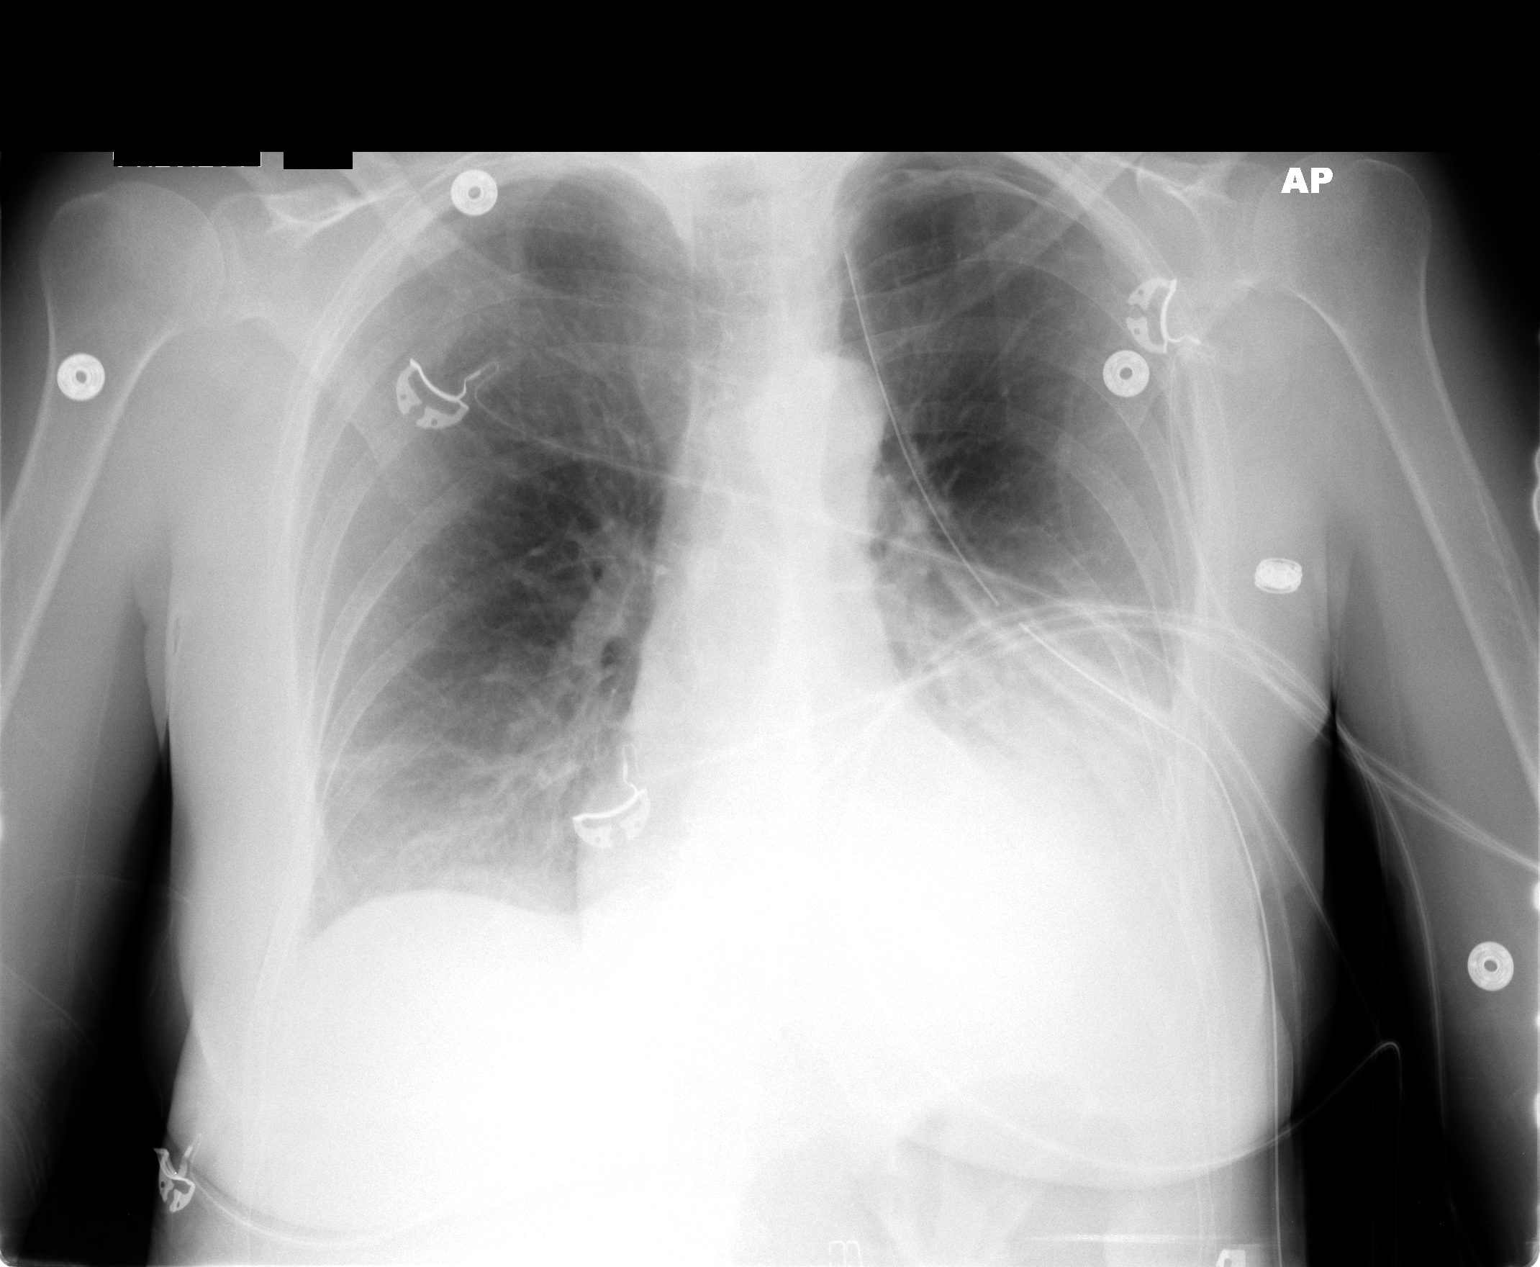

[1 of 1 positions shown; findings below may reference images not displayed]

FINDINGS: Nasogastric tube removed.
Left thoracostomy tube remains.
Heart borderline enlarged.
Minimal right basilar atelectasis.
Consolidation left lower lobe increased since previous study.
Questionable associated left pleural effusion.
Very small left apex pneumothorax.
Bones appear demineralized.
IMPRESSION: Very small left apex pneumothorax despite thoracostomy tube.
Increased left lower lobe consolidation with questionable left
effusion.

## 2009-05-25 IMAGING — CR DG CHEST 2V
2 series · 2 of 2 positions shown · non-contrast
Comparison: the previous day's study

CLINICAL DATA: Pneumothorax

CHEST - 2 VIEW

[w chest pa]
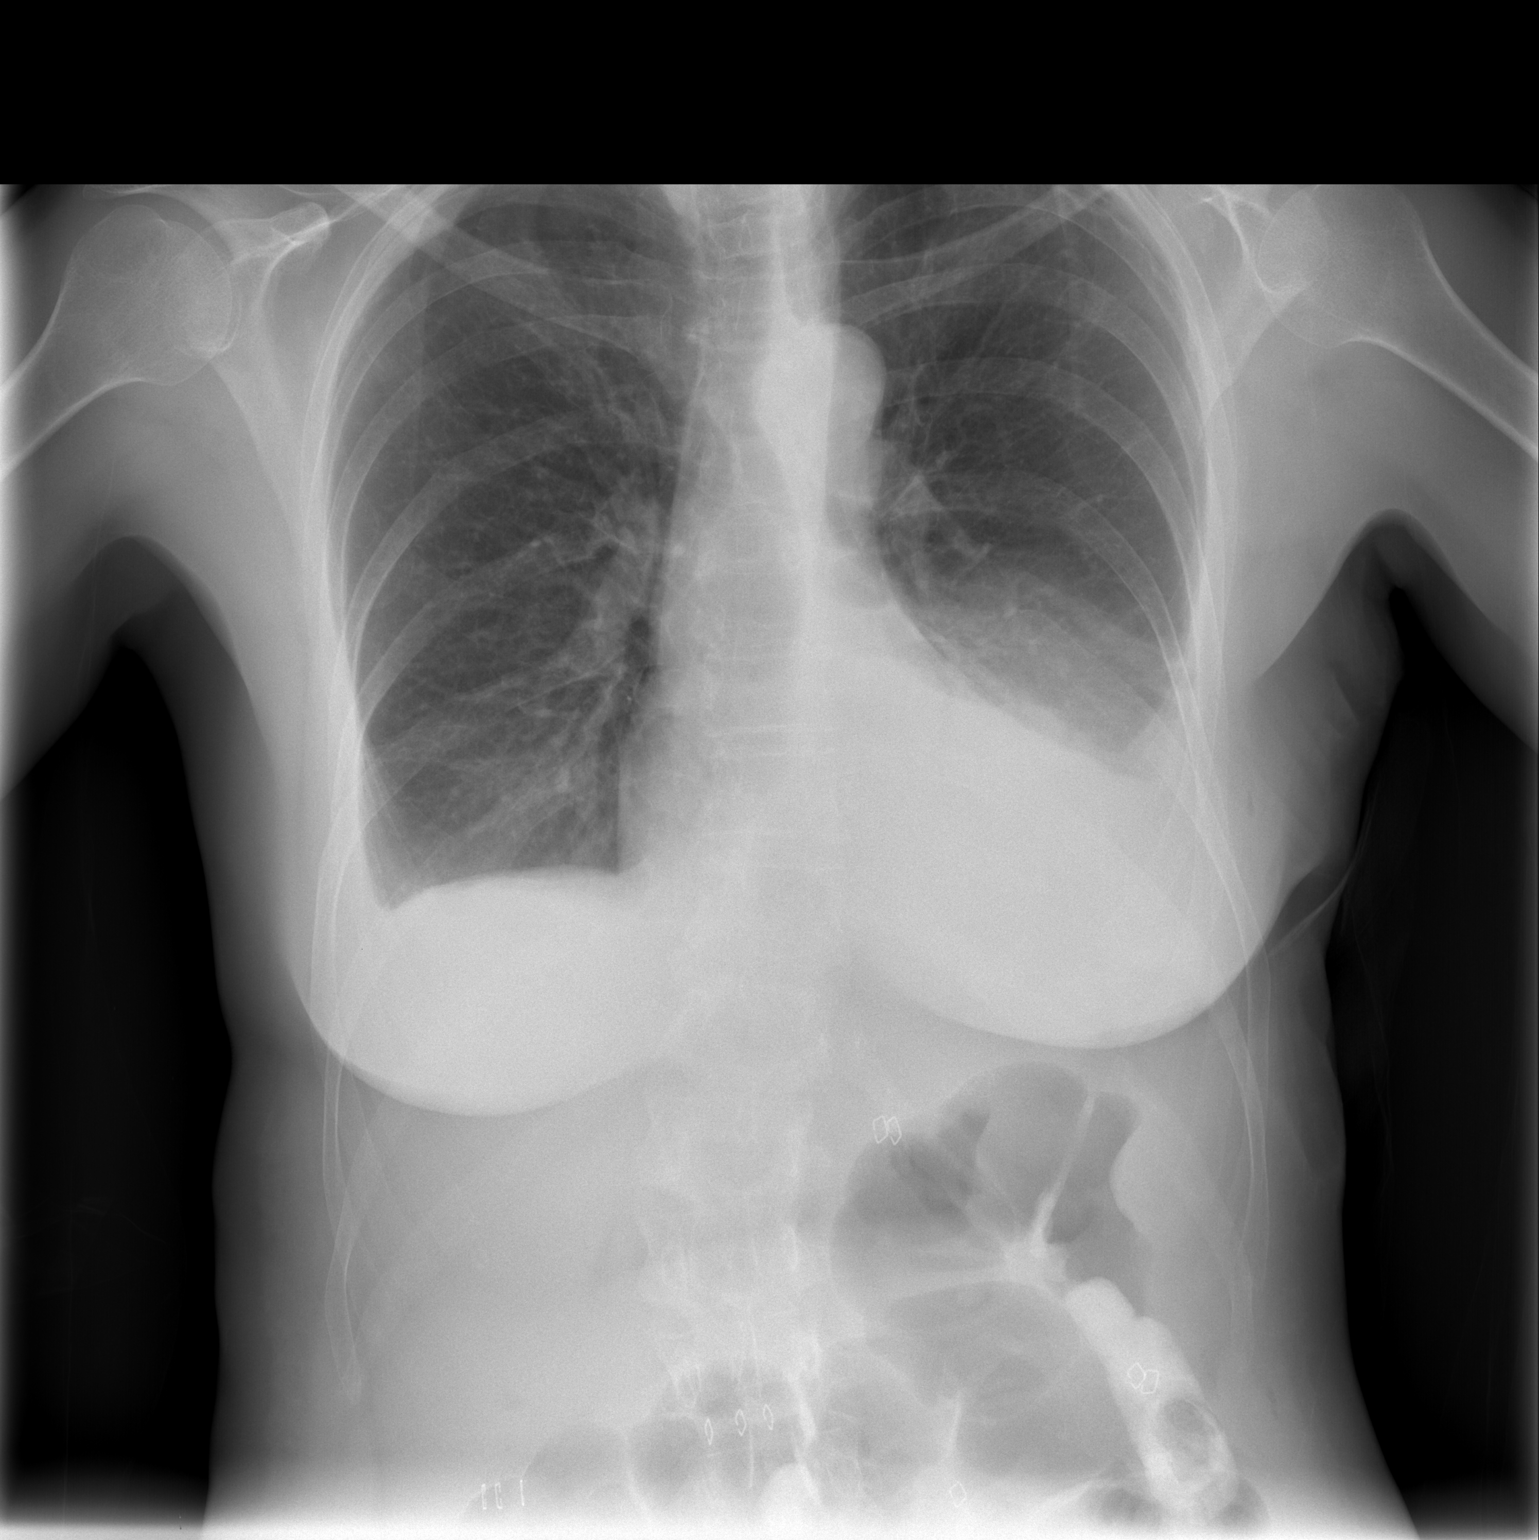

[w chest lat]
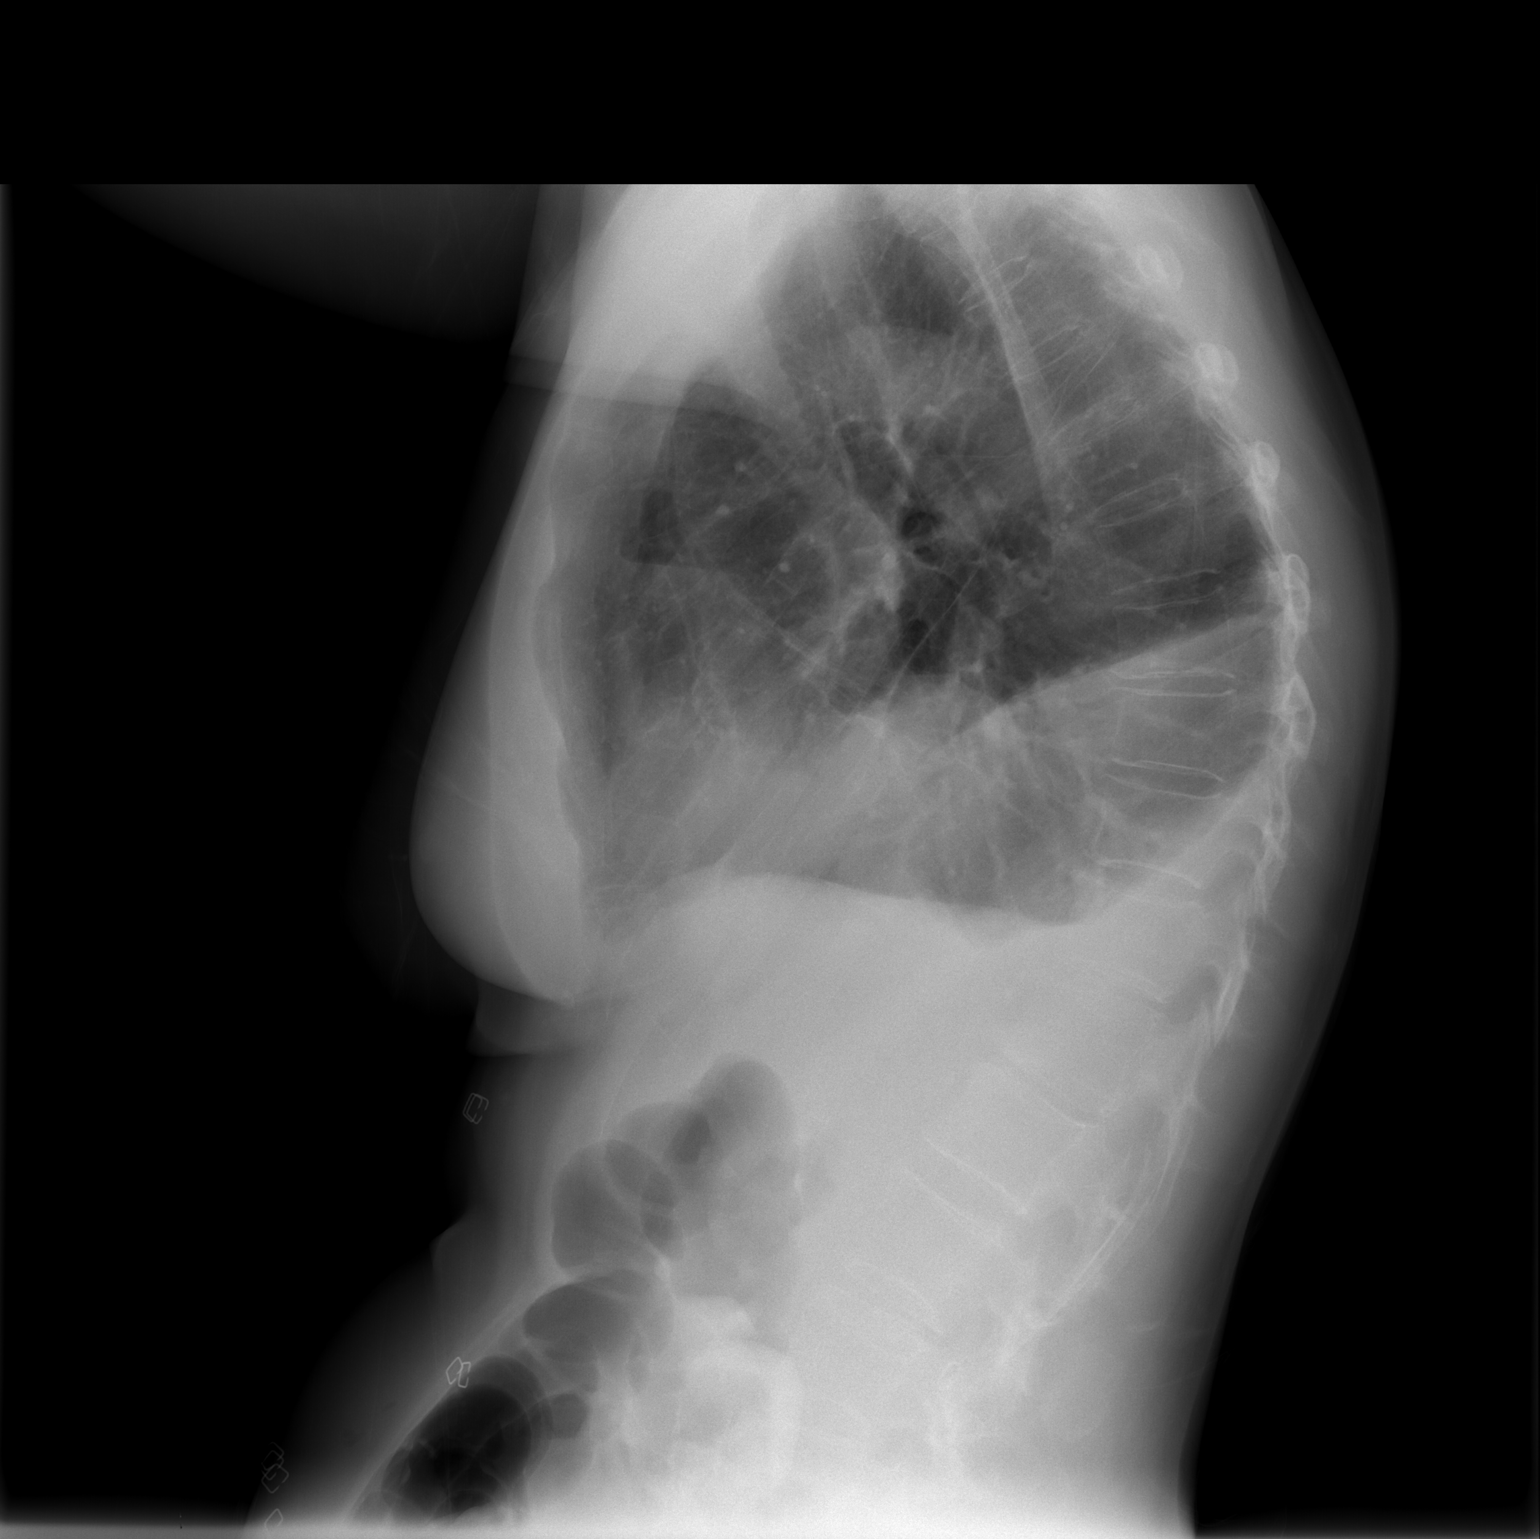

[2 of 2 positions shown; findings below may reference images not displayed]

FINDINGS: Left chest tube has been removed.  Persistent small left
apical pneumothorax, stable in size.  Bilateral pleural effusions,
left greater than right, with bilateral lower lobe
atelectasis/consolidation, left worse than right.  Heart size upper
limits normal.  Skin staples project over the visualized upper
abdomen.
IMPRESSION: 1.  Stable small left apical pneumothorax post chest tube removal.
2.  Stable pleural effusions and   lower lobe consolidation /
atelectasis, left worse than right.

## 2009-05-26 IMAGING — CR DG CHEST 2V
2 series · 2 of 2 positions shown · non-contrast
Comparison: [DATE] and CT chest [DATE]

CLINICAL DATA: Chest tube recently removed.  Mild shortness of
breath, nausea and vomiting.

CHEST - 2 VIEW

[w chest pa]
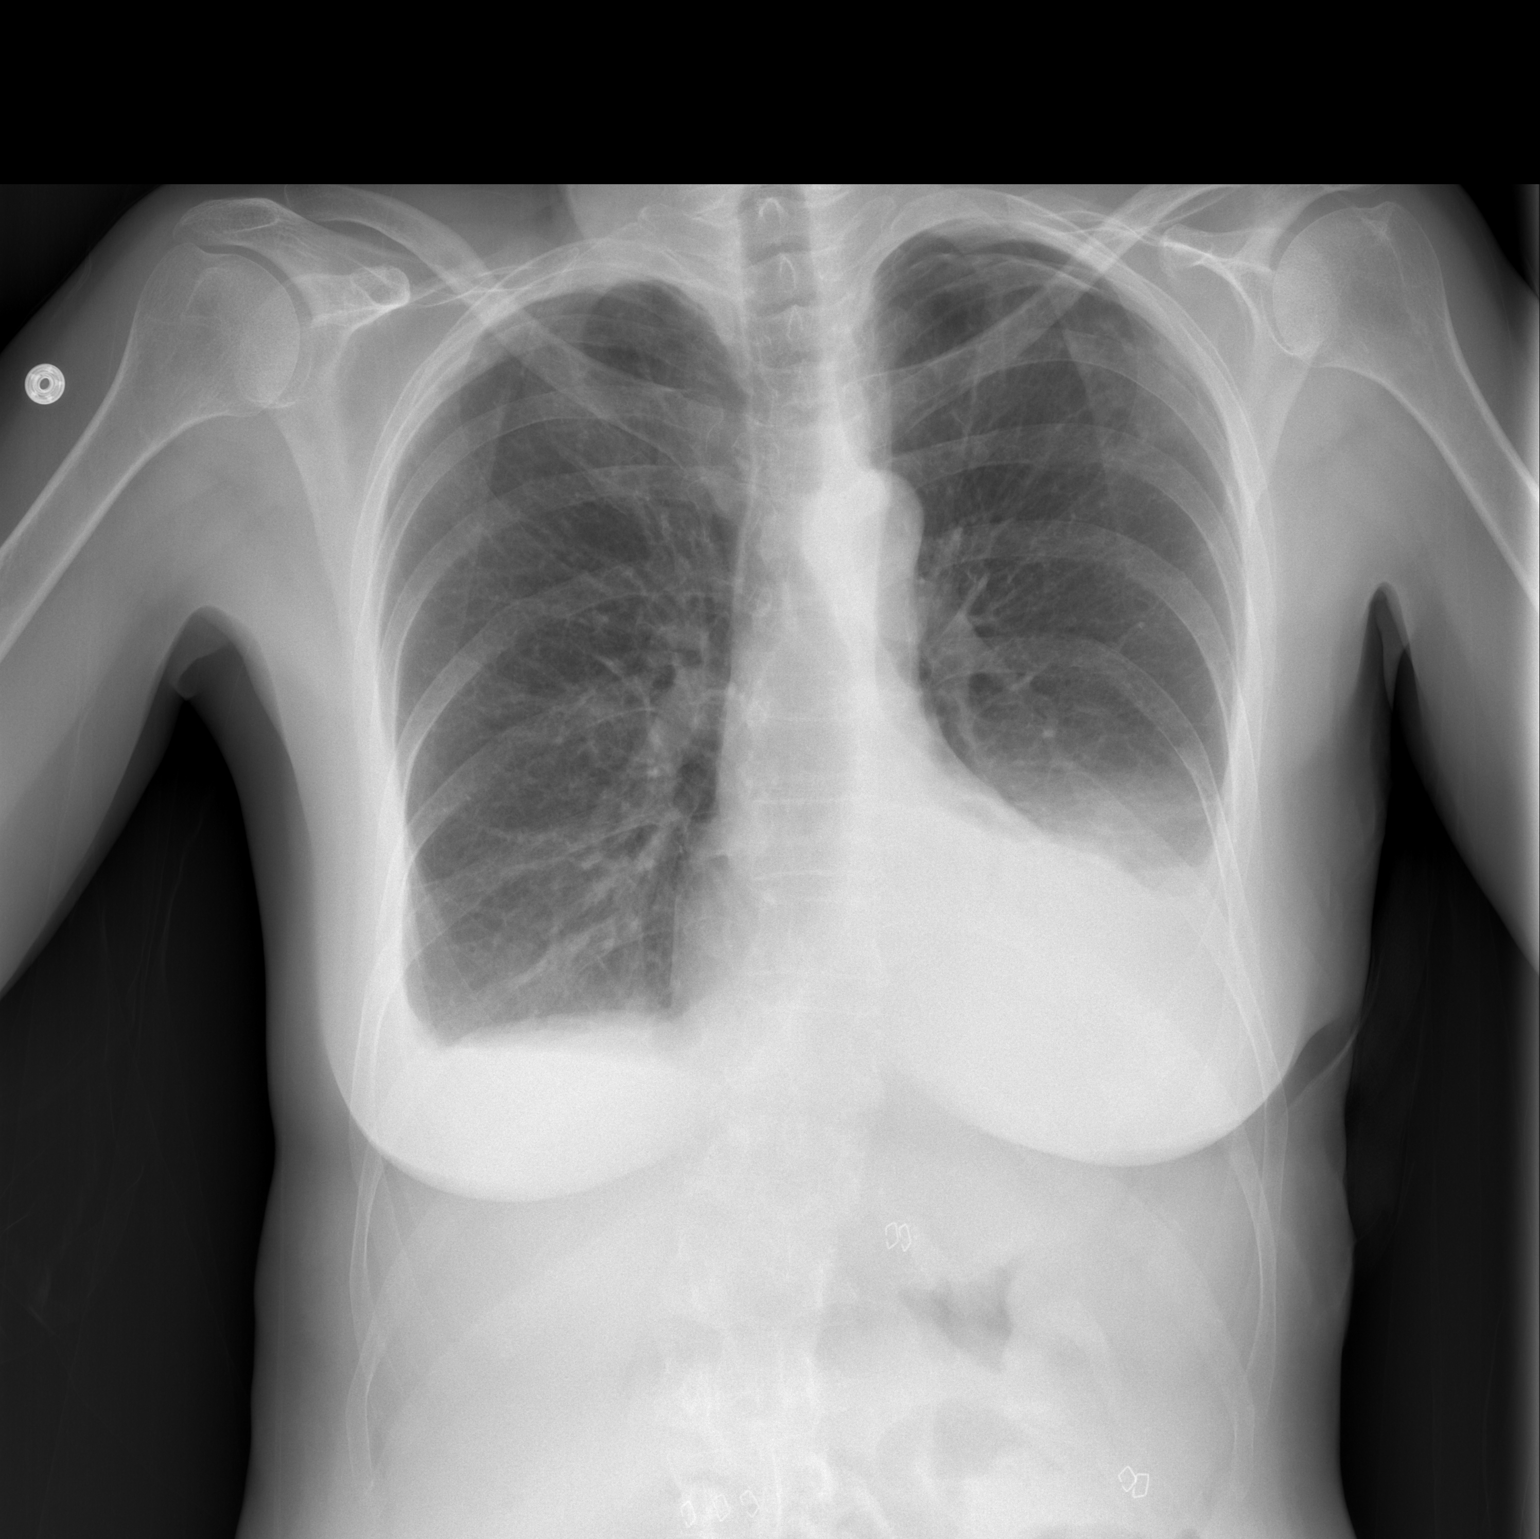

[w chest lat]
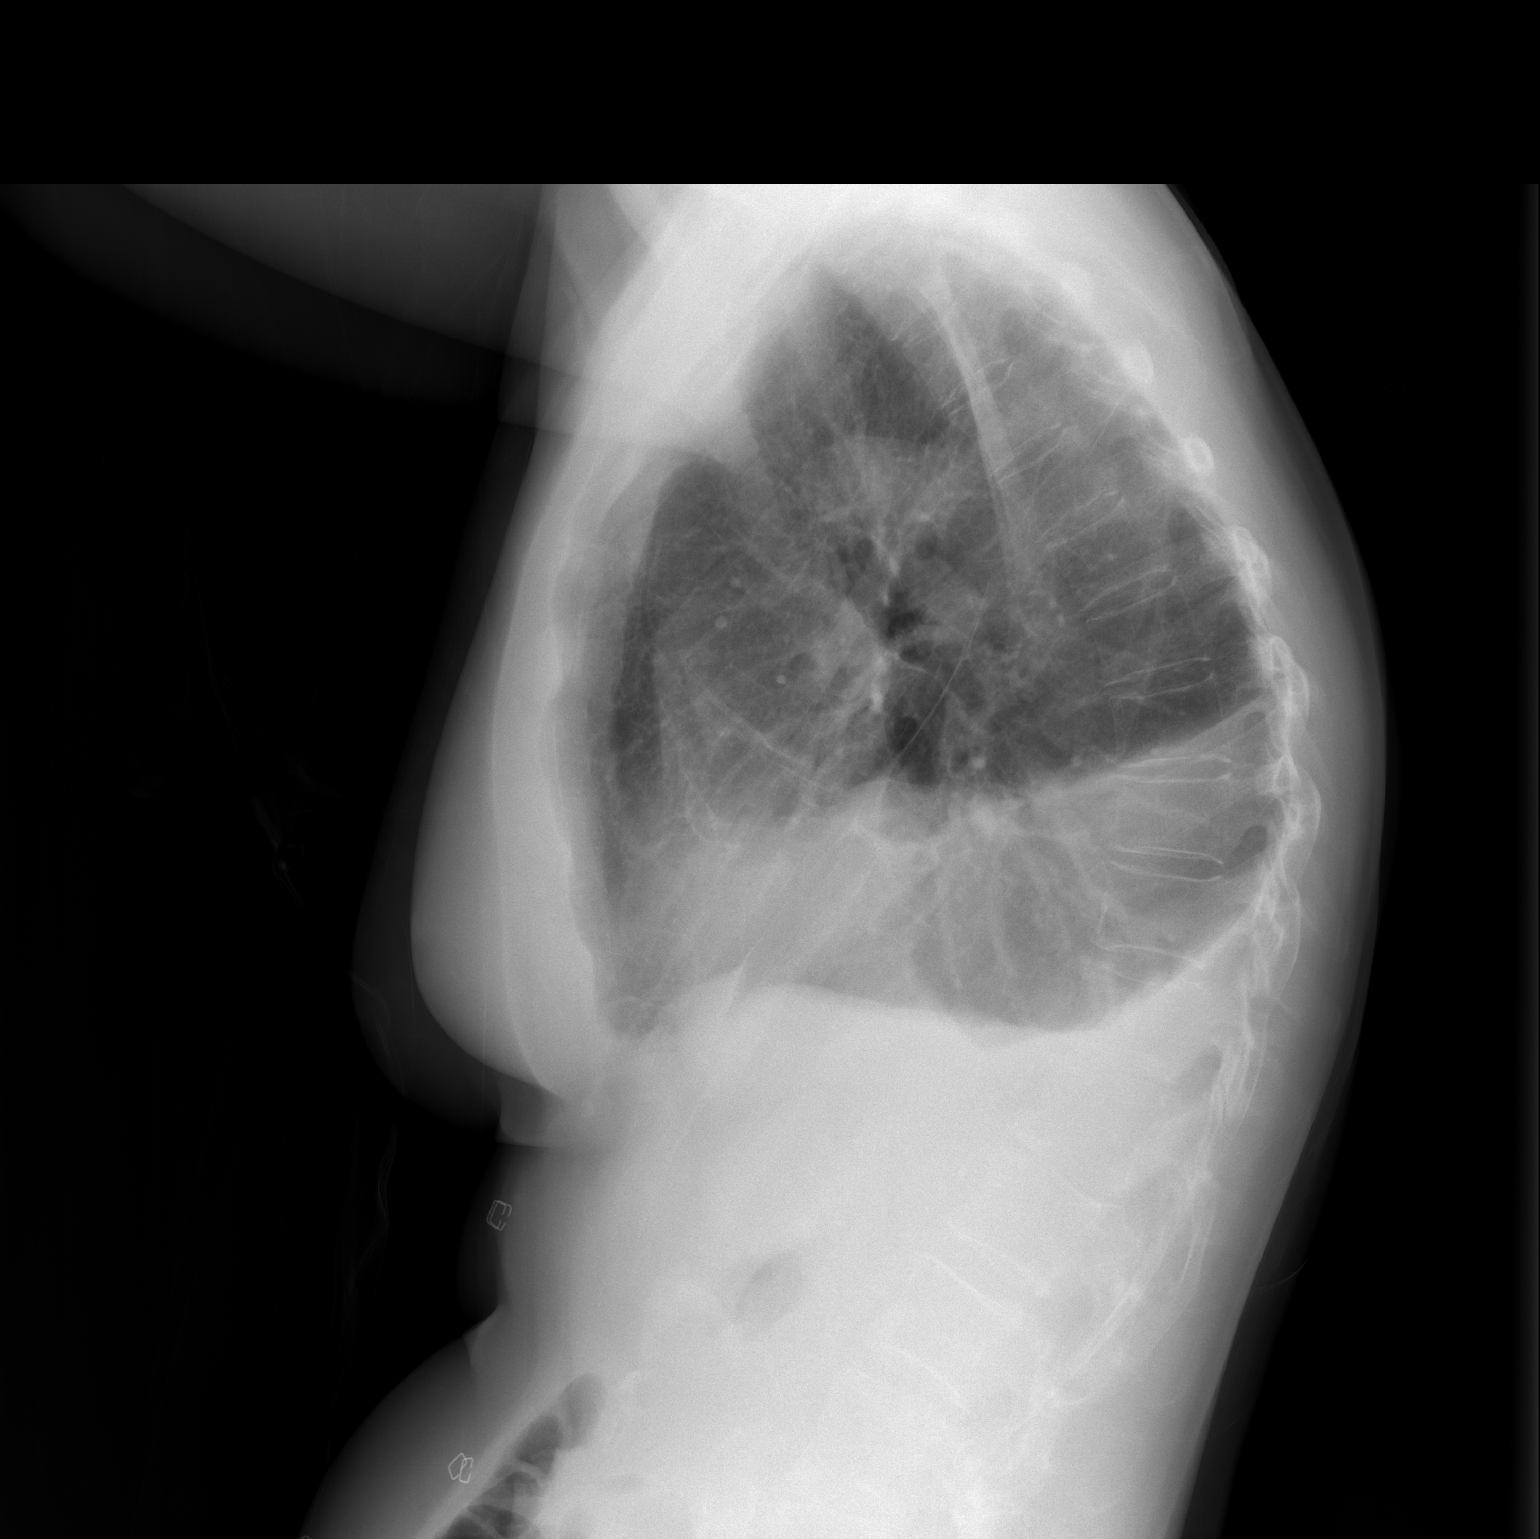

[2 of 2 positions shown; findings below may reference images not displayed]

FINDINGS: Trachea is midline.  Heart size is grossly stable.
Moderate left pleural effusion and small right pleural effusion
persist.  Small left apical pneumothorax is stable.  There is
bibasilar air space disease, left greater than right.
IMPRESSION: 1.  Stable left hydropneumothorax, with a moderate amount of
pleural fluid, stable.
2.  Small right pleural effusion.
3.  Bibasilar air space disease, left greater than right.

## 2009-09-05 DIAGNOSIS — Z9119 Patient's noncompliance with other medical treatment and regimen: Secondary | ICD-10-CM

## 2009-09-05 DIAGNOSIS — I10 Essential (primary) hypertension: Secondary | ICD-10-CM | POA: Insufficient documentation

## 2009-09-05 DIAGNOSIS — J930 Spontaneous tension pneumothorax: Secondary | ICD-10-CM

## 2009-09-05 DIAGNOSIS — J939 Pneumothorax, unspecified: Secondary | ICD-10-CM | POA: Insufficient documentation

## 2009-09-05 DIAGNOSIS — K440 Diaphragmatic hernia with obstruction, without gangrene: Secondary | ICD-10-CM | POA: Insufficient documentation

## 2009-09-08 ENCOUNTER — Ambulatory Visit: Payer: Self-pay | Admitting: Internal Medicine

## 2009-09-15 ENCOUNTER — Ambulatory Visit: Payer: Self-pay | Admitting: Internal Medicine

## 2009-09-15 ENCOUNTER — Ambulatory Visit (HOSPITAL_COMMUNITY): Admission: RE | Admit: 2009-09-15 | Discharge: 2009-09-15 | Payer: Self-pay | Admitting: Internal Medicine

## 2010-05-28 NOTE — Letter (Signed)
Summary: [facility] Instructions  [facility]  [address] [place], [place] [address]   Phone: [phone]  Fax: [phone]       Ariana Maldonado    [birth year]    MRN: [id number]       Procedure Day /Date: 09/15/09 Monday     Arrival Time: 8:00 am     Procedure Time: 9:00 am     Location of Procedure:                     _x _  [facility] ( Outpatient Registration)     PREPARATION FOR COLONOSCOPY WITH MIRALAX  Starting 5 days prior to your procedure (09/10/09) do not eat nuts, seeds, popcorn, corn, beans, peas,  salads, or any raw vegetables.  Do not take any fiber supplements (e.g. Metamucil, Citrucel, and Benefiber). ____________________________________________________________________________________________________   THE DAY BEFORE YOUR PROCEDURE         DATE: 09/14/09 DAY: Sunday  1   Drink clear liquids the entire day-NO SOLID FOOD  2   Do not drink anything colored red or purple.  Avoid juices with pulp.  No orange juice.  3   Drink at least 64 oz. (8 glasses) of fluid/clear liquids during the day to prevent dehydration and help the prep work efficiently.  CLEAR LIQUIDS INCLUDE: Water Jello Ice Popsicles Tea (sugar ok, no milk/cream) Powdered fruit flavored drinks Coffee (sugar ok, no milk/cream) Gatorade Juice: apple, white grape, white cranberry  Lemonade Clear bullion, consomm, broth Carbonated beverages (any kind) Strained chicken noodle soup Hard Candy  4   Mix the entire bottle of Miralax with 64 oz. of Gatorade/Powerade in the morning and put in the refrigerator to chill.  5   At 3:00 pm take 2 Dulcolax/Bisacodyl tablets.  6   At 4:30 pm take one Reglan/Metoclopramide tablet.  7  Starting at 5:00 pm drink one 8 oz glass of the Miralax mixture every 15-20 minutes until you have finished drinking the entire 64 oz.  You should finish drinking prep around 7:30 or 8:00 pm.  8   If you are nauseated, you may take the 2nd  Reglan/Metoclopramide tablet at 6:30 pm.        9    At 8:00 pm take 2 more DULCOLAX/Bisacodyl tablets.        THE DAY OF YOUR PROCEDURE      DATE:  09/15/09 DAY: Monday  You may drink clear liquids until 5:00 am (4 HOURS BEFORE PROCEDURE).   MEDICATION INSTRUCTIONS  Unless otherwise instructed, you should take regular prescription medications with a small sip of water as early as possible the morning of your procedure.        OTHER INSTRUCTIONS  You will need a responsible adult at least 64 years of age to accompany you and drive you home.   This person must remain in the waiting room during your procedure.  Wear loose fitting clothing that is easily removed.  Leave jewelry and other valuables at home.  However, you may wish to bring a book to read or an iPod/MP3 player to listen to music as you wait for your procedure to start.  Remove all body piercing jewelry and leave at home.  Total time from sign-in until discharge is approximately 2-3 hours.  You should go home directly after your procedure and rest.  You can resume normal activities the day after your procedure.  The day of your procedure you should not:  Drive   Make legal decisions   Operate machinery   Drink alcohol   Return to work  You will receive specific instructions about eating, activities and medications before you leave.   The above instructions have been reviewed and explained to me by  Lamona Curl CMA Duncan Dull)  Sep 08, 2009 3:24 PM     I fully understand and can verbalize these instructions _____________________________ Date 09/08/09

## 2010-05-28 NOTE — Assessment & Plan Note (Signed)
Summary: gastric ischemia (per dr Ariana Maldonado surgical note)   History of Present Illness Visit Type: consult Primary GI MD: Ariana Sar MD Requesting Provider: Robert Bellow, MD Chief Complaint: follow up gastric ischemia, pt is feeling well now and denies any complaints.  Pt has never had colonoscopy. History of Present Illness:   [name] who is status post laparoscopic repair of both an incarcerated left diaphragmatic hernia and partial gastrectomy of gastric fundus due to ischemic episodes in January 2011. Patient is status post pneumothorax and placement of a chest tube. A post operative barium swallow on 05/22/09 did not show any leak. Patient had an initial weight loss of about 20 pounds but is now close to her usual weight. She denies any dysphagia, abdominal pain, food intolerance or any change in bowel habits. She has never had a screening colonoscopy.   GI Review of Systems      Denies abdominal pain, acid reflux, belching, bloating, chest pain, dysphagia with liquids, dysphagia with solids, heartburn, loss of appetite, nausea, vomiting, vomiting blood, weight loss, and  weight gain.        Denies anal fissure, black tarry stools, change in bowel habit, constipation, diarrhea, diverticulosis, fecal incontinence, heme positive stool, hemorrhoids, irritable bowel syndrome, jaundice, light color stool, liver problems, rectal bleeding, and  rectal pain. Preventive Screening-Counseling & Management  Alcohol-Tobacco     Smoking Status: never    Current Medications (verified): 1)  Lisinopril-Hydrochlorothiazide 10-12.5 Mg Tabs (Lisinopril-Hydrochlorothiazide) .... Take 1 Tablet By Mouth Once A Day  Allergies (verified): 1)  Sulfa  Past History:  Past Medical History: Reviewed history from 09/05/2009 and no changes required. 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  Daily Caffeine Use 2 sodas Smoking Status:  never  Review of Systems  The patient denies allergy/sinus, anemia, anxiety-new, arthritis/joint pain, back pain, blood in urine, breast changes/lumps, confusion, cough, coughing up blood, depression-new, fainting, fatigue, fever, headaches-new, hearing problems, heart murmur, heart rhythm changes, itching, menstrual pain, muscle pains/cramps, night sweats, nosebleeds, pregnancy symptoms, shortness of breath, skin rash, sleeping problems, sore throat, swelling of feet/legs, swollen lymph glands, thirst - excessive, urination - excessive, urination changes/pain, urine leakage, vision changes, and voice change.         Pertinent positive and negative review of systems were noted in the above HPI. All other ROS was otherwise negative.   Vital Signs:  Patient profile:   65 year old female Height:      64 inches Weight:      108 pounds BMI:     18.61 Pulse rate:   76 / minute Pulse rhythm:   regular BP sitting:   140 / 92  (left arm) Cuff size:   regular  Vitals Entered By: Ariana Maldonado CMA Ariana Maldonado) (Sep 08, 2009 2:40 PM)  Physical Exam  General:  Well developed, well nourished, no acute distress. Mouth:  No deformity or lesions, dentition normal. Neck:  Supple; no masses or thyromegaly. Lungs:  Clear throughout to auscultation. Heart:  Regular rate and  rhythm; no murmurs, rubs,  or bruits. Abdomen:  well-healed post laparoscopic scars in the upper abdomen. Normoactive bowel sounds. No tenderness. Liver edge at costal margin, no distention. Rectal:  moderate amount of soft Hemoccult negative stool. Extremities:  No clubbing, cyanosis, edema or deformities noted. Skin:  Intact without significant lesions or rashes. Psych:  Alert and cooperative. Normal mood and affect.   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 Since she is asymptomatic, no further evaluation is necessary at this time.  Problem # 2:  SPECIAL SCREENING FOR MALIGNANT NEOPLASMS COLON (ICD-V76.51)  Patient has no history of a prior colonoscopy. She is a good candidate for a screening colonoscopy. We will schedule that at this time.  Orders: ZCOL (ZCOL)  Patient Instructions: 1)  colonoscopy instructions discussed with the patient. 2)  No further upper GI evaluation is needed at this time since patient has been asymptomatic following repair of her hernia. 3)  Copy sent to : Dr Ariana Maldonado 4)  The medication list was reviewed and reconciled.  All changed / newly prescribed medications were explained.  A complete medication list was provided to the patient / caregiver. Prescriptions: DULCOLAX 5 MG  TBEC (BISACODYL) Day before procedure take 2 at 3pm and 2 at 8pm.  #4 x 0   Entered by:   Ariana Maldonado CMA (AAMA)   Authorized by:   Ariana Carwin MD   Signed by:   Ariana Maldonado CMA (AAMA) on 09/08/2009   Method used:   Electronically to        [facility] # [phone]* (retail)       [address]       [place], [place]  [address]       Ph: [phone]       Fax: [phone]   RxID:   [id number] REGLAN 10 MG  TABS  (METOCLOPRAMIDE HCL) As per prep instructions.  #2 x 0   Entered by:   Ariana Maldonado CMA (AAMA)   Authorized by:   Ariana Carwin MD   Signed by:   Ariana Maldonado CMA (AAMA) on 09/08/2009   Method used:   Electronically to        [facility] # [phone]* (retail)       [address]       [place], [place]  [address]       Ph: [phone]       Fax: [phone]   RxID:   [id number] MIRALAX   POWD (POLYETHYLENE GLYCOL 3350) As per prep  instructions.  #255gm x 0   Entered by:   Ariana Maldonado CMA (AAMA)   Authorized by:   Ariana Carwin MD   Signed by:   Ariana Maldonado CMA (AAMA) on 09/08/2009   Method used:   Electronically to        [facility] # [phone]* (retail)       [address]       [place], [place]  [address]       Ph: [phone]       Fax: [phone]   RxID:   [id number]

## 2010-05-28 NOTE — Procedures (Signed)
Summary: Colonoscopy  Patient: [name] Note: All result statuses are Final unless otherwise noted.  Tests: (1) Colonoscopy (COL)   COL Colonoscopy           DONE     [facility]     [address] [place], [place]  [address]           COLONOSCOPY PROCEDURE REPORT           PATIENT:  Ariana Maldonado, Ariana Maldonado  MR#:  [id number]     BIRTHDATE:  17-May-1945, 64 yrs. old  GENDER:  female     ENDOSCOPIST:  Hedwig Morton. Juanda Chance, MD     REF. BY:  Robert Bellow, M.D.     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  Digital rectal exam was performed and     revealed no rectal masses.   The  endoscope was introduced through     the anus and advanced to the cecum, which was identified by both     the appendix and ileocecal valve, without limitations.  The     quality of the prep was adequate, using MiraLax.  The instrument     was then slowly withdrawn as the colon was fully examined.     <<PROCEDUREIMAGES>>           FINDINGS:  No polyps or cancers were seen (see image1, image2,     image3, and image4). tortuous colon, had to exrt abdominal     pressure to negotiate through   Retroflexed views in the rectum     revealed no abnormalities.    The scope was then withdrawn from     the patient and the procedure completed.           COMPLICATIONS:  None     ENDOSCOPIC IMPRESSION:     1) No polyps or cancers     2) Normal colonoscopy     RECOMMENDATIONS:     1) high fiber diet     REPEAT EXAM:  In 10 year(s) for.           ______________________________     Hedwig Morton. Juanda Chance, MD           CC:           n.     eSIGNED:   Hedwig Morton.  Ronetta Molla at 09/15/2009 09:41 AM           Cobbins, Ladean Raya, [phone]  Note: An exclamation mark (!) indicates a result that was not dispersed into the flowsheet. Document Creation Date: 09/15/2009 9:42 AM _______________________________________________________________________  (1) Order result status: Final Collection or observation date-time: 09/15/2009 09:29 Requested date-time:  Receipt date-time:  Reported date-time:  Referring Physician:   Ordering Physician: Lina Sar [id number]) Specimen Source:  Source: Launa Grill Order Number: [id number] Lab site:   Appended Document: Colonoscopy Recall is in IDX for 08/2019.

## 2010-05-29 NOTE — Procedures (Signed)
Summary: Instruction for procedure/[facility] (out pt)  Instruction for procedure/[facility] (out pt)   Imported By: Sherian Rein 09/11/2009 08:00:02  _____________________________________________________________________  External Attachment:    Type:   Image     Comment:   External Document

## 2010-07-13 LAB — BASIC METABOLIC PANEL
BUN: 11 mg/dL (ref 6–23)
BUN: 7 mg/dL (ref 6–23)
CO2: 24 mEq/L (ref 19–32)
CO2: 25 mEq/L (ref 19–32)
Calcium: 8.3 mg/dL — ABNORMAL LOW (ref 8.4–10.5)
Chloride: 103 mEq/L (ref 96–112)
Chloride: 103 mEq/L (ref 96–112)
Chloride: 105 mEq/L (ref 96–112)
Creatinine, Ser: 0.8 mg/dL (ref 0.4–1.2)
GFR calc Af Amer: >60 mL/min (ref >60)
GFR calc Af Amer: >60 mL/min (ref >60)
GFR calc non Af Amer: >60 mL/min (ref >60)
GFR calc non Af Amer: >60 mL/min (ref >60)
GFR calc non Af Amer: >60 mL/min (ref >60)
Glucose, Bld: 117 mg/dL — ABNORMAL HIGH (ref 70–99)
Glucose, Bld: 138 mg/dL — ABNORMAL HIGH (ref 70–99)
Glucose, Bld: 99 mg/dL (ref 70–99)
Potassium: 3.4 mEq/L — ABNORMAL LOW (ref 3.5–5.1)
Potassium: 3.5 mEq/L (ref 3.5–5.1)
Potassium: 3.6 mEq/L (ref 3.5–5.1)
Potassium: 3.9 mEq/L (ref 3.5–5.1)
Sodium: 132 mEq/L — ABNORMAL LOW (ref 135–145)
Sodium: 134 mEq/L — ABNORMAL LOW (ref 135–145)

## 2010-07-13 LAB — CBC
HCT: 31.5 % — ABNORMAL LOW (ref 36.0–46.0)
HCT: 34.2 % — ABNORMAL LOW (ref 36.0–46.0)
HCT: 41.9 % (ref 36.0–46.0)
HCT: 46.9 % — ABNORMAL HIGH (ref 36.0–46.0)
HCT: 47.5 % — ABNORMAL HIGH (ref 36.0–46.0)
Hemoglobin: 10.3 g/dL — ABNORMAL LOW (ref 12.0–15.0)
Hemoglobin: 13 g/dL (ref 12.0–15.0)
Hemoglobin: 15.6 g/dL — ABNORMAL HIGH (ref 12.0–15.0)
Hemoglobin: 16.1 g/dL — ABNORMAL HIGH (ref 12.0–15.0)
MCHC: 33.4 g/dL (ref 30.0–36.0)
MCHC: 33.5 g/dL (ref 30.0–36.0)
MCHC: 33.7 g/dL (ref 30.0–36.0)
MCHC: 33.8 g/dL (ref 30.0–36.0)
Platelets: 204 10*3/uL (ref 150–400)
Platelets: 205 10*3/uL (ref 150–400)
Platelets: 244 10*3/uL (ref 150–400)
Platelets: 305 10*3/uL (ref 150–400)
Platelets: 316 10*3/uL (ref 150–400)
RBC: 4.42 MIL/uL (ref 3.87–5.11)
RBC: 5.35 MIL/uL — ABNORMAL HIGH (ref 3.87–5.11)
RDW: 12.6 % (ref 11.5–15.5)
RDW: 12.9 % (ref 11.5–15.5)
RDW: 12.9 % (ref 11.5–15.5)
RDW: 13.3 % (ref 11.5–15.5)
RDW: 13.6 % (ref 11.5–15.5)
WBC: 16.1 10*3/uL — ABNORMAL HIGH (ref 4.0–10.5)
WBC: 16.4 10*3/uL — ABNORMAL HIGH (ref 4.0–10.5)
WBC: 20.1 10*3/uL — ABNORMAL HIGH (ref 4.0–10.5)
WBC: 9.8 10*3/uL (ref 4.0–10.5)

## 2010-07-13 LAB — MAGNESIUM
Magnesium: 1.8 mg/dL (ref 1.5–2.5)
Magnesium: 1.9 mg/dL (ref 1.5–2.5)

## 2010-07-13 LAB — COMPREHENSIVE METABOLIC PANEL
ALT: 78 U/L — ABNORMAL HIGH (ref 0–35)
AST: 23 U/L (ref 0–37)
AST: 26 U/L (ref 0–37)
AST: 98 U/L — ABNORMAL HIGH (ref 0–37)
Albumin: 4.3 g/dL (ref 3.5–5.2)
Alkaline Phosphatase: 59 U/L (ref 39–117)
BUN: 9 mg/dL (ref 6–23)
BUN: 9 mg/dL (ref 6–23)
CO2: 25 mEq/L (ref 19–32)
CO2: 27 mEq/L (ref 19–32)
Calcium: 8.7 mg/dL (ref 8.4–10.5)
Calcium: 8.7 mg/dL (ref 8.4–10.5)
Chloride: 101 mEq/L (ref 96–112)
Creatinine, Ser: 0.95 mg/dL (ref 0.4–1.2)
GFR calc Af Amer: >60 mL/min (ref >60)
GFR calc non Af Amer: >60 mL/min (ref >60)
GFR calc non Af Amer: >60 mL/min (ref >60)
Glucose, Bld: 111 mg/dL — ABNORMAL HIGH (ref 70–99)
Glucose, Bld: 180 mg/dL — ABNORMAL HIGH (ref 70–99)
Potassium: 3.7 mEq/L (ref 3.5–5.1)
Sodium: 135 mEq/L (ref 135–145)
Sodium: 136 mEq/L (ref 135–145)
Total Bilirubin: 1.1 mg/dL (ref 0.3–1.2)
Total Protein: 7.4 g/dL (ref 6.0–8.3)

## 2010-07-13 LAB — URINE MICROSCOPIC-ADD ON: Urine-Other: NONE SEEN

## 2010-07-13 LAB — URINALYSIS, ROUTINE W REFLEX MICROSCOPIC
Glucose, UA: NEGATIVE mg/dL
Ketones, ur: 40 mg/dL — AB
Leukocytes, UA: NEGATIVE
Leukocytes, UA: NEGATIVE
Nitrite: NEGATIVE
Protein, ur: 30 mg/dL — AB
Specific Gravity, Urine: 1.005 (ref 1.005–1.030)
Urobilinogen, UA: 1 mg/dL (ref 0.0–1.0)
pH: 6.5 (ref 5.0–8.0)

## 2010-07-13 LAB — CULTURE, BLOOD (ROUTINE X 2)
Culture: NO GROWTH
Culture: NO GROWTH

## 2010-07-13 LAB — DIFFERENTIAL
Basophils Absolute: 0 10*3/uL (ref 0.0–0.1)
Basophils Absolute: 0 10*3/uL (ref 0.0–0.1)
Basophils Relative: 0 % (ref 0–1)
Basophils Relative: 0 % (ref 0–1)
Eosinophils Absolute: 0 10*3/uL (ref 0.0–0.7)
Eosinophils Relative: 0 % (ref 0–5)
Lymphocytes Relative: 4 % — ABNORMAL LOW (ref 12–46)
Lymphocytes Relative: 7 % — ABNORMAL LOW (ref 12–46)
Monocytes Absolute: 0.9 10*3/uL (ref 0.1–1.0)
Monocytes Relative: 5 % (ref 3–12)
Neutro Abs: 13.6 10*3/uL — ABNORMAL HIGH (ref 1.7–7.7)
Neutro Abs: 15.9 10*3/uL — ABNORMAL HIGH (ref 1.7–7.7)
Neutro Abs: 17.3 10*3/uL — ABNORMAL HIGH (ref 1.7–7.7)
Neutrophils Relative %: 85 % — ABNORMAL HIGH (ref 43–77)
Neutrophils Relative %: 91 % — ABNORMAL HIGH (ref 43–77)

## 2010-07-13 LAB — URINE CULTURE
Colony Count: NO GROWTH
Colony Count: NO GROWTH
Culture: NO GROWTH
Culture: NO GROWTH

## 2010-07-13 LAB — LIPID PANEL: Triglycerides: 50 mg/dL (ref <150)

## 2010-07-13 LAB — TSH: TSH: 0.598 u[IU]/mL (ref 0.350–4.500)

## 2010-07-13 LAB — BRAIN NATRIURETIC PEPTIDE: Pro B Natriuretic peptide (BNP): 113 pg/mL — ABNORMAL HIGH (ref 0.0–100.0)

## 2011-05-05 ENCOUNTER — Ambulatory Visit (INDEPENDENT_AMBULATORY_CARE_PROVIDER_SITE_OTHER): Payer: BC Managed Care – PPO

## 2011-05-05 DIAGNOSIS — R05 Cough: Secondary | ICD-10-CM

## 2011-05-05 DIAGNOSIS — J019 Acute sinusitis, unspecified: Secondary | ICD-10-CM

## 2011-07-26 ENCOUNTER — Encounter: Payer: Self-pay | Admitting: Internal Medicine

## 2011-12-24 ENCOUNTER — Telehealth: Payer: Self-pay

## 2011-12-24 NOTE — Telephone Encounter (Signed)
Pt would like to know her most recent b/p reading, total cholesterol, LDL, HDL, Trigliseride level, and blood sugar level. This is needed so she can complete a health assement online for her insurance. Best# [phone]

## 2011-12-25 NOTE — Telephone Encounter (Signed)
LMOM to CB-- Lipids have not been checked since 05/2009, sugar since 02/2011... Chart at nursing station for review when patient calls back.

## 2011-12-27 NOTE — Telephone Encounter (Signed)
Called pt back and gave her the most recent labs/BP/Height and Weight we have for her and the dates they were checked. Reminded pt that she is overdue for her CPE and she stated she had one scheduled but we called to cancel and she hasn't called [place] back to reschedule, but she will do so.

## 2012-01-26 ENCOUNTER — Other Ambulatory Visit: Payer: Self-pay | Admitting: Dermatology

## 2012-03-20 ENCOUNTER — Ambulatory Visit (INDEPENDENT_AMBULATORY_CARE_PROVIDER_SITE_OTHER): Payer: BC Managed Care – PPO | Admitting: Internal Medicine

## 2012-03-20 VITALS — BP 162/91 | HR 74 | Temp 98.1°F | Resp 16 | Ht 63.0 in | Wt 109.0 lb

## 2012-03-20 DIAGNOSIS — I10 Essential (primary) hypertension: Secondary | ICD-10-CM

## 2012-03-20 DIAGNOSIS — Z79899 Other long term (current) drug therapy: Secondary | ICD-10-CM

## 2012-03-20 DIAGNOSIS — Z0000 Encounter for general adult medical examination without abnormal findings: Secondary | ICD-10-CM

## 2012-03-20 DIAGNOSIS — E559 Vitamin D deficiency, unspecified: Secondary | ICD-10-CM

## 2012-03-20 LAB — CBC WITH DIFFERENTIAL/PLATELET
Eosinophils Absolute: 0.2 10*3/uL (ref 0.0–0.7)
Eosinophils Relative: 2 % (ref 0–5)
HCT: 41.2 % (ref 36.0–46.0)
Hemoglobin: 14.1 g/dL (ref 12.0–15.0)
Lymphs Abs: 2.1 10*3/uL (ref 0.7–4.0)
MCH: 28.5 pg (ref 26.0–34.0)
MCHC: 34.2 g/dL (ref 30.0–36.0)
MCV: 83.4 fL (ref 78.0–100.0)
Monocytes Absolute: 0.8 10*3/uL (ref 0.1–1.0)
Monocytes Relative: 8 % (ref 3–12)
Neutrophils Relative %: 68 % (ref 43–77)
RBC: 4.94 MIL/uL (ref 3.87–5.11)

## 2012-03-20 LAB — POCT URINALYSIS DIPSTICK
Nitrite, UA: NEGATIVE
Protein, UA: NEGATIVE
Spec Grav, UA: <=1.005
Urobilinogen, UA: 0.2

## 2012-03-20 LAB — COMPREHENSIVE METABOLIC PANEL
Alkaline Phosphatase: 76 U/L (ref 39–117)
BUN: 18 mg/dL (ref 6–23)
Glucose, Bld: 84 mg/dL (ref 70–99)
Sodium: 136 mEq/L (ref 135–145)
Total Bilirubin: 0.8 mg/dL (ref 0.3–1.2)

## 2012-03-20 LAB — LIPID PANEL
Cholesterol: 201 mg/dL — ABNORMAL HIGH (ref 0–200)
LDL Cholesterol: 144 mg/dL — ABNORMAL HIGH (ref 0–99)
Total CHOL/HDL Ratio: 4.6 Ratio
VLDL: 13 mg/dL (ref 0–40)

## 2012-03-20 NOTE — Addendum Note (Signed)
Addended by: Clydia Llano on: 03/20/2012 01:00 PM   Modules accepted: Orders

## 2012-03-20 NOTE — Patient Instructions (Addendum)

## 2012-03-20 NOTE — Progress Notes (Signed)
  Subjective:    Patient ID: Ariana Maldonado, female    DOB: 12/30/1945, 66 y.o.   MRN: [id number]  HPI    Review of Systems  Constitutional: Negative.   HENT: Negative.   Eyes: Negative.   Respiratory: Negative.   Cardiovascular: Negative.   Gastrointestinal: Negative.   Musculoskeletal: Negative.   Neurological: Negative.   Hematological: Negative.   Psychiatric/Behavioral: Negative.        Objective:   Physical Exam  Vitals reviewed. Constitutional: She is oriented to person, place, and time. She appears well-developed and well-nourished.  HENT:  Right Ear: External ear normal.  Left Ear: External ear normal.  Nose: Nose normal.  Mouth/Throat: Oropharynx is clear and moist.  Eyes: EOM are normal. Pupils are equal, round, and reactive to light.  Neck: Normal range of motion. No tracheal deviation present. No thyromegaly present.  Cardiovascular: Normal rate, regular rhythm and normal heart sounds.   Pulmonary/Chest: Effort normal and breath sounds normal.  Abdominal: Soft. Bowel sounds are normal.  Musculoskeletal: Normal range of motion.  Lymphadenopathy:    She has no cervical adenopathy.  Neurological: She is alert and oriented to person, place, and time. She has normal reflexes.  Skin: Skin is warm and dry.  Psychiatric: She has a normal mood and affect. Her behavior is normal.   Results for orders placed in visit on 03/20/12  POCT URINALYSIS DIPSTICK      Component Value Range   Color, UA yellow     Clarity, UA clear     Glucose, UA neg     Bilirubin, UA neg     Ketones, UA neg     Spec Grav, UA <=1.005     Blood, UA trace     pH, UA 5.5     Protein, UA neg     Urobilinogen, UA 0.2     Nitrite, UA neg     Leukocytes, UA Negative     ekg nl       Assessment & Plan:  Healthy cpe, htn controlled at home RF meds 65yr RTC 6 mos/ bring bp monitor from home in next 1-2 weeks to compare with Ariana Maldonado

## 2012-03-31 ENCOUNTER — Other Ambulatory Visit: Payer: Self-pay | Admitting: Internal Medicine

## 2012-04-03 ENCOUNTER — Telehealth: Payer: Self-pay

## 2012-04-03 NOTE — Telephone Encounter (Signed)
Per Dr. Ernestene Mention note from her last visit, she is to follow up in 6 months, so she will need to come in in May 2014.  We can refill her medication until then.  A 90 day supply was just sent to the pharmacy on 03/31/12

## 2012-04-03 NOTE — Telephone Encounter (Signed)
PT STATES SHE HAD SEEN DR GUEST AND THEY CALLED IN HER BP MEDS, BUT IT WAS ONLY FOR 90 DAYS, REALLY WOULD LIKE [place] TO REFILL HER MEDICINE WITH 3 REFILLS FROM NOW ON DOESN'T FEEL LIKE HAVING TO COME IN [facility] TIME. PLEASE CALL [phone]   [facility] [address]

## 2012-04-03 NOTE — Telephone Encounter (Signed)
Patients office visit states she is to follow up in 6 months, please advise if we can send in a refill on her meds.

## 2012-04-04 NOTE — Telephone Encounter (Signed)
Called patient to advise  °

## 2012-09-25 ENCOUNTER — Telehealth: Payer: Self-pay | Admitting: Physician Assistant

## 2012-09-25 ENCOUNTER — Ambulatory Visit: Payer: BC Managed Care – PPO

## 2012-09-25 ENCOUNTER — Ambulatory Visit (INDEPENDENT_AMBULATORY_CARE_PROVIDER_SITE_OTHER): Payer: BC Managed Care – PPO | Admitting: Internal Medicine

## 2012-09-25 VITALS — BP 140/90 | HR 100 | Temp 98.1°F | Resp 18 | Ht 64.0 in | Wt 115.0 lb

## 2012-09-25 DIAGNOSIS — S99922A Unspecified injury of left foot, initial encounter: Secondary | ICD-10-CM

## 2012-09-25 DIAGNOSIS — S92302A Fracture of unspecified metatarsal bone(s), left foot, initial encounter for closed fracture: Secondary | ICD-10-CM

## 2012-09-25 DIAGNOSIS — M25572 Pain in left ankle and joints of left foot: Secondary | ICD-10-CM

## 2012-09-25 DIAGNOSIS — S99929A Unspecified injury of unspecified foot, initial encounter: Secondary | ICD-10-CM

## 2012-09-25 DIAGNOSIS — M25579 Pain in unspecified ankle and joints of unspecified foot: Secondary | ICD-10-CM

## 2012-09-25 IMAGING — CR DG FOOT COMPLETE 3+V*L*
2 series · 2 of 2 positions shown · non-contrast
Comparison: None.

CLINICAL DATA: Head injury.  Pain.

LEFT FOOT - COMPLETE 3+ VIEW

[AP]
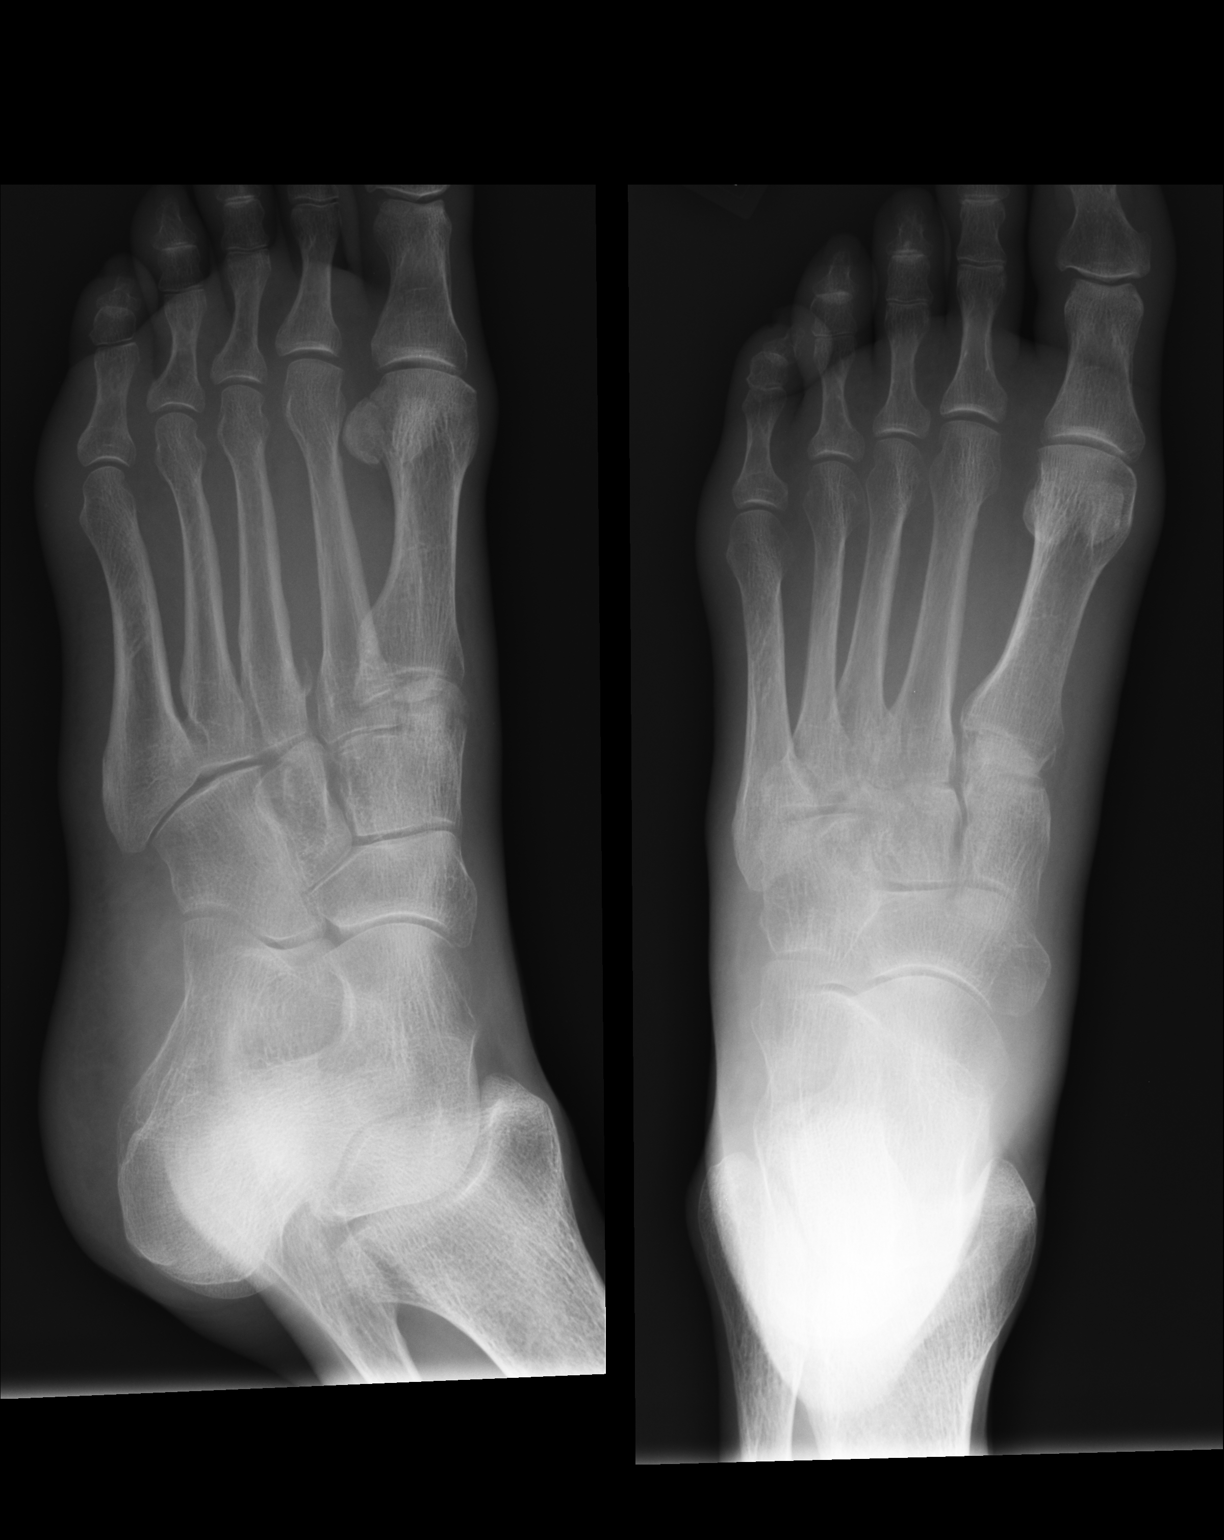

[lateral]
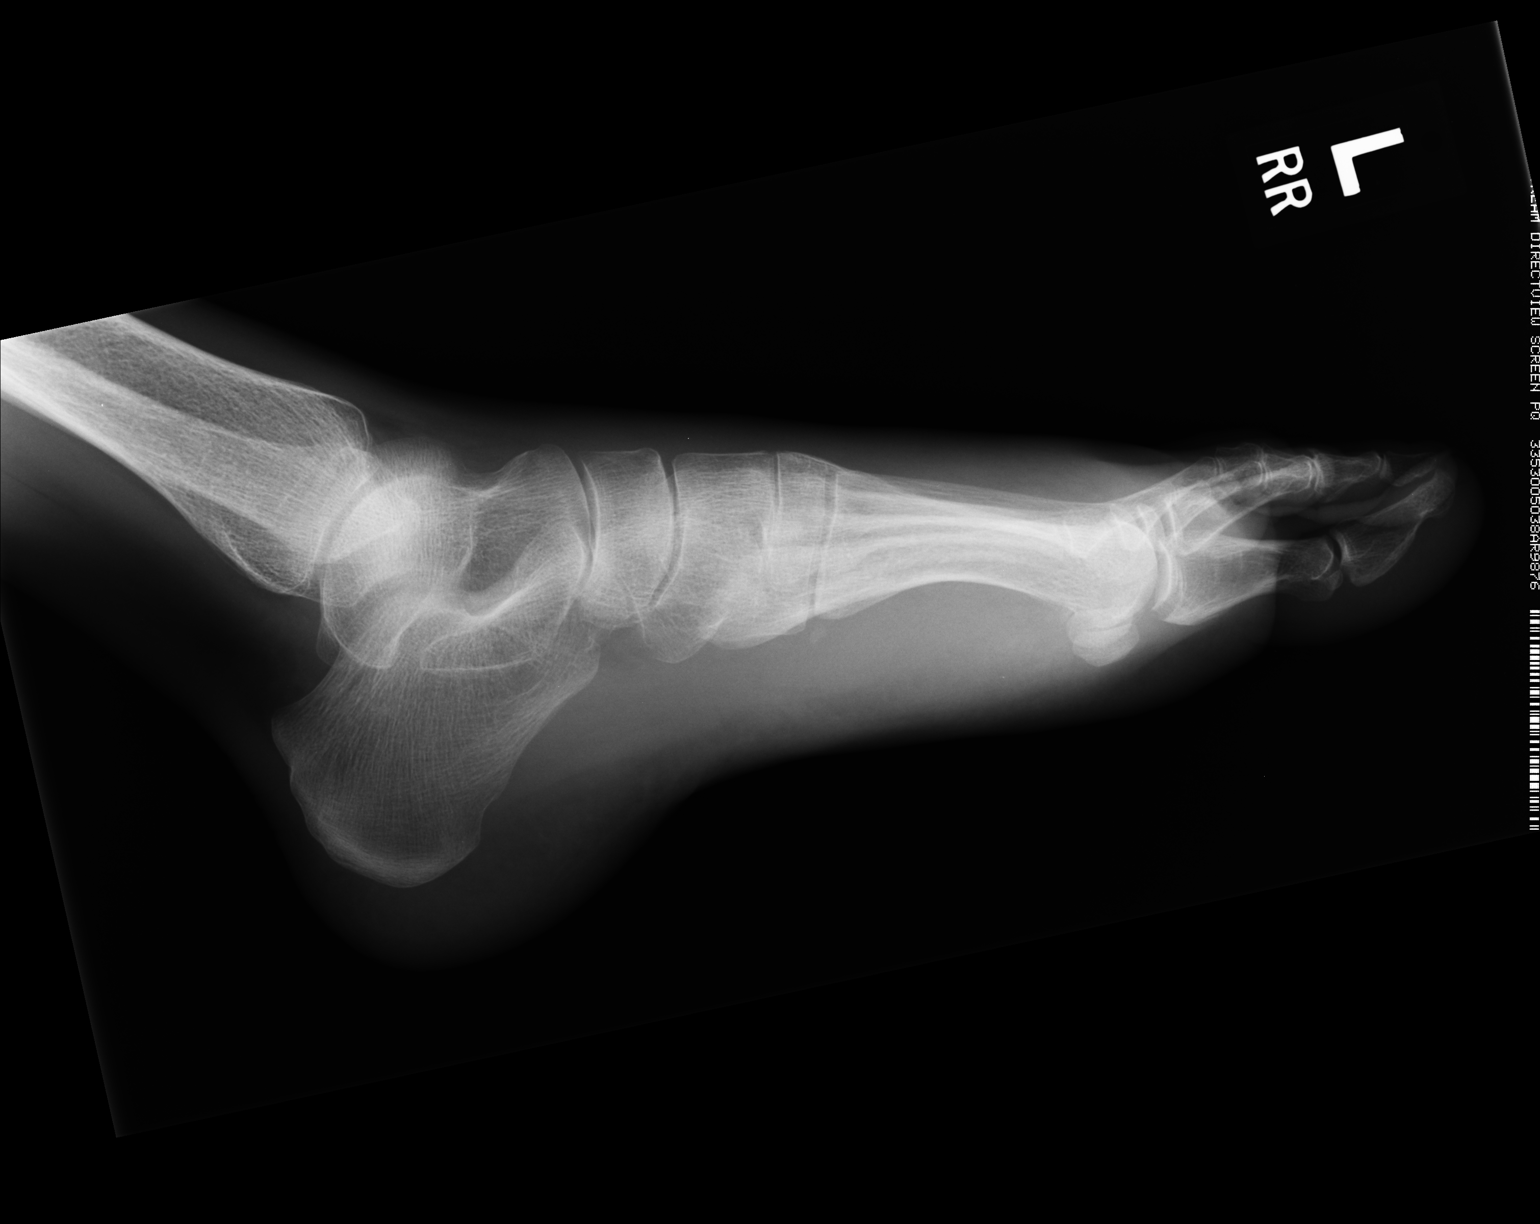

[2 of 2 positions shown; findings below may reference images not displayed]

FINDINGS: A small a avulsion fracture is noted along the medial
aspect of the base of the third metatarsal.  No additional
fractures are evident.  No radiopaque foreign body is evident.
IMPRESSION: 1.  Small avulsion fracture along the base of the third metatarsal.

Clinically significant discrepancy from primary report, if
provided: None

## 2012-09-25 NOTE — Telephone Encounter (Signed)
Please let pt know that the radiologist's over read indicates an avulsion fracture of the 3rd metatarsal - which is what our initial read was as well.  No change in management.  Will plan to see her in 1 week, sooner if any symptoms worsen or if she has concerns

## 2012-09-25 NOTE — Patient Instructions (Addendum)
Wear the CAM boot.  Plan to follow up here in 5-7 days (unless we hear differently from the radiologist).  I will let you know the radiologist's report when it comes through later this afternoon.  Touch-down weight bearing only.  Elevate the foot and ice frequently for the next 48 hours.  Ibuprofen 600mg  every 8 hours with food to help with pain and inflammation.  Please let us know if anything is acutely worsening

## 2012-09-25 NOTE — Progress Notes (Signed)
  Subjective:    Patient ID: Ariana Maldonado, female    DOB: [birth year], 67 y.o.   MRN: [id number]  HPI   [name] is a very pleasant 67 yr old female with injury to the left food.  Was walking down the driveway last evening, hit an uneven patch and twisted the foot and fell.  Was unable to bear weight immediately after the injury.  Still unable to bear weight.  Swelling occurred soon after.  Now left midfoot is quite swollen and bruised compared to the right.  She iced the foot last night, no meds yet.  No previous injury to this foot.  Left ankle is uninvolved.  Review of Systems  Musculoskeletal: Positive for joint swelling and arthralgias.  Skin: Positive for color change.  All other systems reviewed and are negative.       Objective:   Physical Exam  Vitals reviewed. Constitutional: She is oriented to person, place, and time. She appears well-developed and well-nourished. No distress.  HENT:  Head: Normocephalic and atraumatic.  Eyes: Conjunctivae are normal. No scleral icterus.  Cardiovascular:  Pulses:      Dorsalis pedis pulses are 2+ on the left side.       Posterior tibial pulses are 2+ on the left side.  Musculoskeletal:       Right ankle: Normal.       Left ankle: Normal.       Right foot: Normal.       Left foot: She exhibits decreased range of motion, tenderness, bony tenderness (across midfoot and metatarsals) and swelling. She exhibits normal capillary refill, no crepitus, no deformity and no laceration.       Feet:  Swelling and ecchymosis across midfoot and extending to plantar surface  Neurological: She is alert and oriented to person, place, and time. No sensory deficit.  Skin: Skin is warm and dry.  Psychiatric: She has a normal mood and affect. Her behavior is normal.     [facility] reading (PRIMARY) by  Dr. Perrin Maltese - avulsion fx of the proximal 3rd metatarsal, no other fx appreciated       Assessment & Plan:  Metatarsal fracture, left, closed, initial  encounter  Foot injury, left, initial encounter - Plan: DG Foot Complete Left  Pain in joint, ankle and foot, left - Plan: DG Foot Complete Left   [name] is a very pleasant 67 yr old female with an avulsion fracture of the left 3rd metatarsal.  She has been placed in a CAM boot. Walker for ambulation.  Touch down weightbearing only.  Encouraged ice, elevation, and NSAIDs.  Will plan to see her back in 7 days, sooner if anything is acutely worsening.

## 2012-09-25 NOTE — Telephone Encounter (Signed)
Patient is calling to get the results of an x-ray done today. [phone]

## 2012-09-26 NOTE — Telephone Encounter (Signed)
Spoke with pt regarding x-ray.  Will follow up as planned

## 2012-10-02 ENCOUNTER — Ambulatory Visit (INDEPENDENT_AMBULATORY_CARE_PROVIDER_SITE_OTHER): Payer: BC Managed Care – PPO | Admitting: Family Medicine

## 2012-10-02 ENCOUNTER — Ambulatory Visit: Payer: BC Managed Care – PPO

## 2012-10-02 VITALS — BP 122/76 | HR 92 | Temp 97.8°F | Resp 16

## 2012-10-02 DIAGNOSIS — S92302D Fracture of unspecified metatarsal bone(s), left foot, subsequent encounter for fracture with routine healing: Secondary | ICD-10-CM

## 2012-10-02 DIAGNOSIS — S8290XD Unspecified fracture of unspecified lower leg, subsequent encounter for closed fracture with routine healing: Secondary | ICD-10-CM

## 2012-10-02 IMAGING — CR DG FOOT COMPLETE 3+V*L*
3 series · 3 of 3 positions shown · non-contrast
Comparison: [DATE]

CLINICAL DATA: Left foot fracture 1 week ago, persistent
swelling/bruising

LEFT FOOT - COMPLETE 3+ VIEW

[AP]
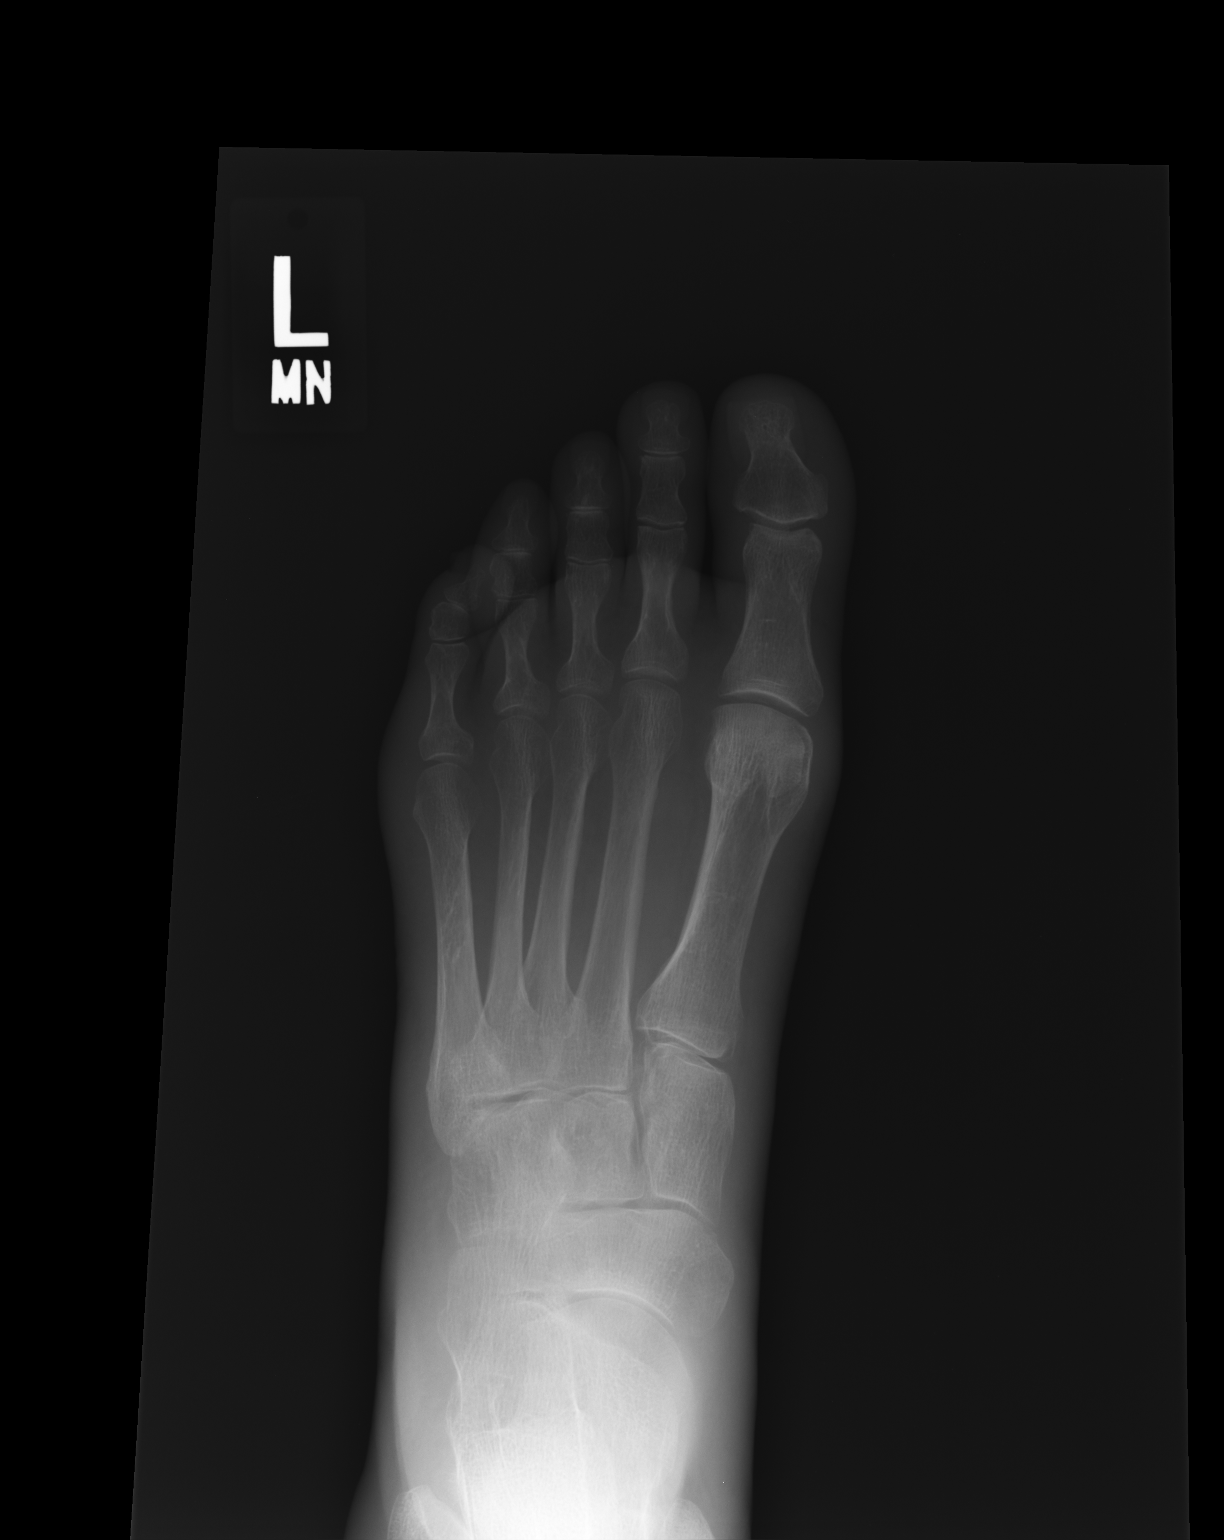

[ap obl int rot]
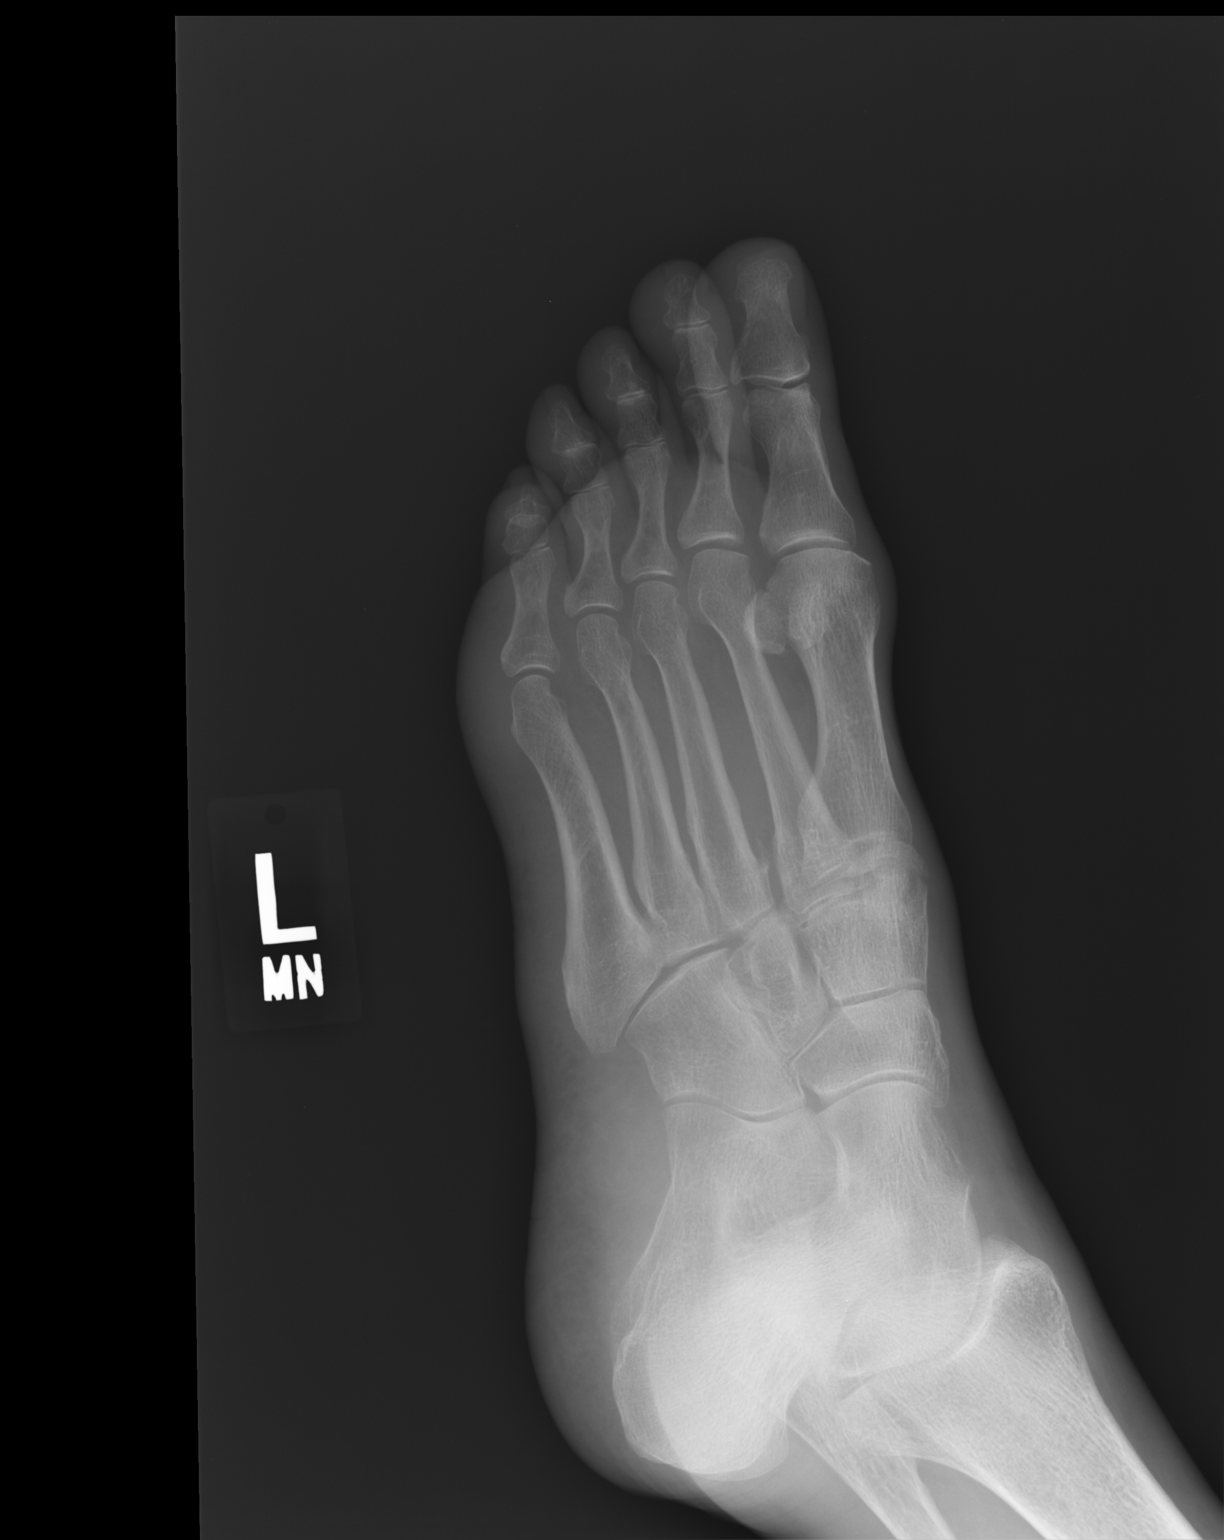

[lateral]
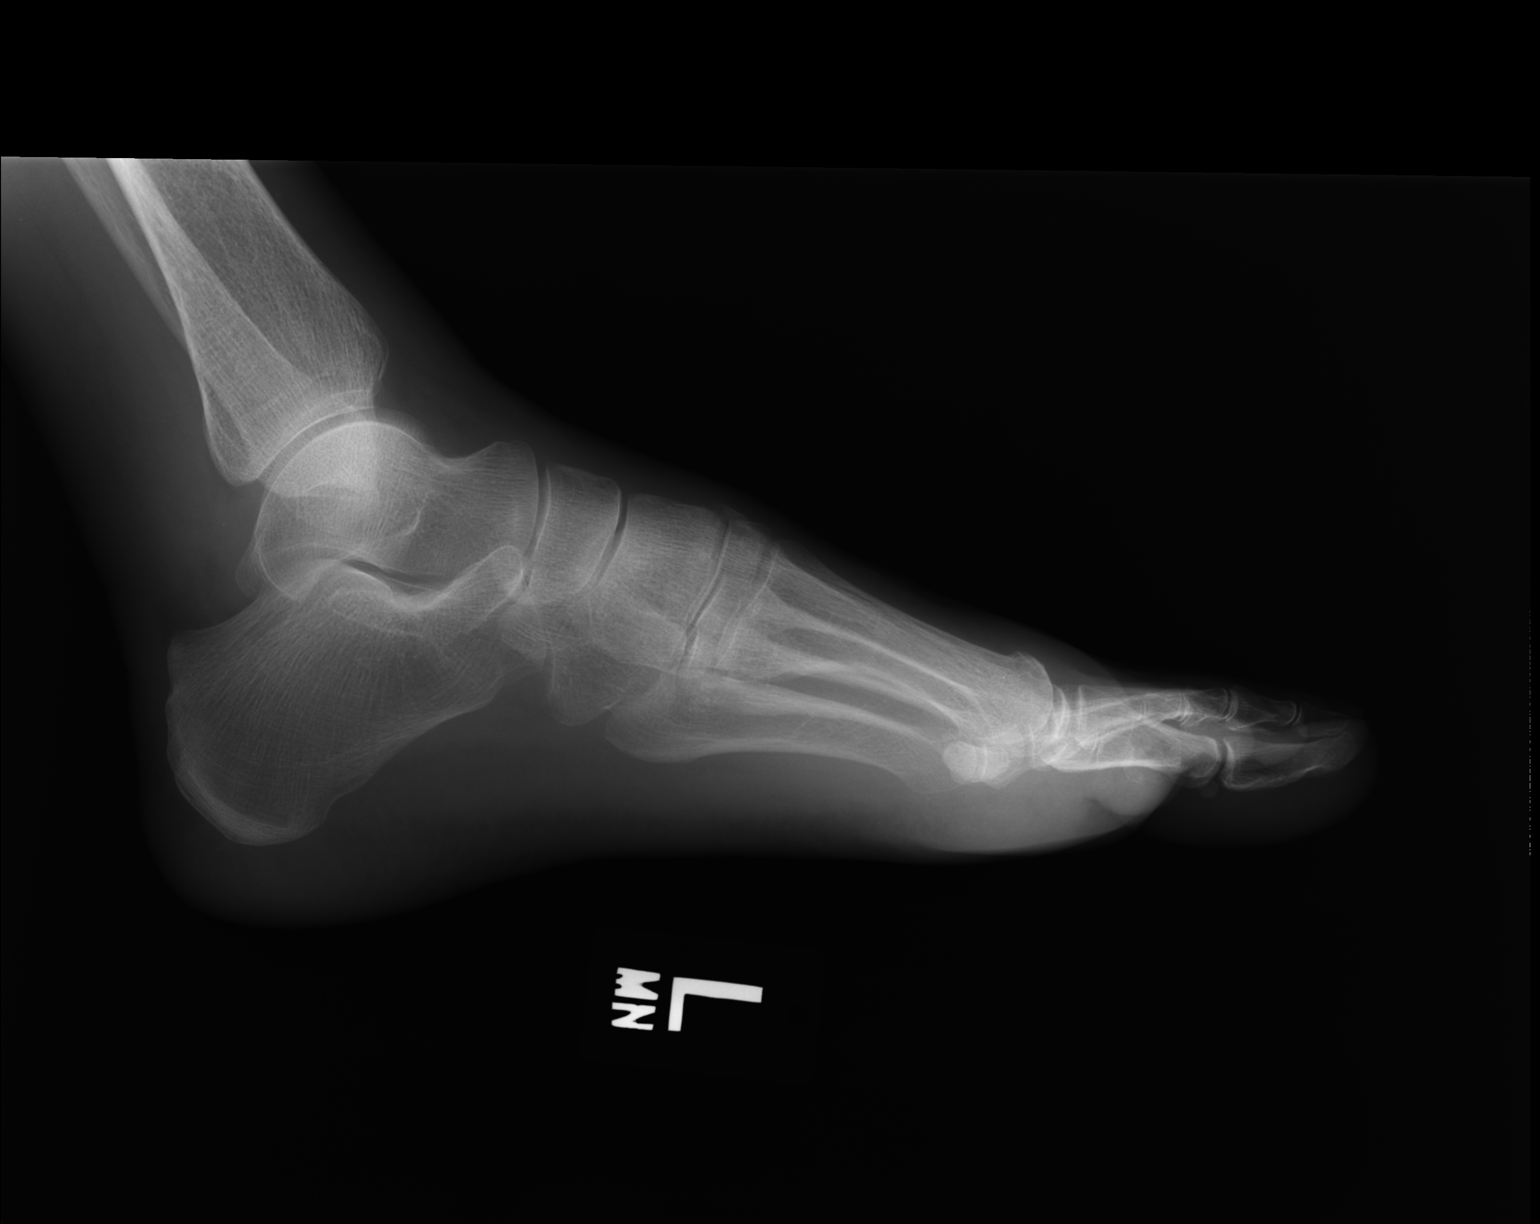

[3 of 3 positions shown; findings below may reference images not displayed]

FINDINGS: Stable avulsion fracture along the medial base of the
third metatarsal.

On the lateral view, there is a possible fracture along the
superior aspect of the midfoot, possibly reflecting an avulsion
fracture of the medial cuneiform.

Joint spaces are essentially preserved.

Soft tissues are grossly unremarkable.
IMPRESSION: Stable avulsion fracture along the medial base of the third
metatarsal.

Possible avulsion fracture of the medial cuneiform.  Correlate for
point tenderness.

Clinically significant discrepancy from primary report, if
provided: None

## 2012-10-02 NOTE — Progress Notes (Signed)
  Subjective:    Patient ID: Ariana Maldonado, female    DOB: 1945/08/19, 67 y.o.   MRN: [id number]  HPI   Ariana Maldonado is a very pleasant 67 yr old female here for follow up on a metatarsal fracture.  Initial injury occurred 7 days ago.  Has worn the boot consistently, minimal weight bearing, elevating as much as possible.  Thinks possibly a little bit improved.  Advil helps with pain.  Foot still swollen, bruised.  Pain with weight bearing still.  Needs handicapped placard for work.  Works at [employer], has to walk a long way from parking lot.    Review of Systems  Musculoskeletal: Positive for arthralgias.  Skin: Positive for color change.  All other systems reviewed and are negative.       Objective:   Physical Exam  Vitals reviewed. Constitutional: She is oriented to person, place, and time. She appears well-developed and well-nourished. No distress.  HENT:  Head: Normocephalic and atraumatic.  Eyes: Conjunctivae are normal. No scleral icterus.  Pulmonary/Chest: Effort normal.  Musculoskeletal:       Left ankle: Normal.       Right foot: Normal.       Left foot: She exhibits decreased range of motion, tenderness, bony tenderness and swelling. She exhibits normal capillary refill and no crepitus.       Feet:  L foot - swelling reduce from last week but still present; significant ecchymosis at arch of foot, extending to plantar surface and to toes (indicated in blue on graphic); TTP over midfoot and posterior to medial mallelous (red on graphic); limited ROM of toes; no numbness, occasional tingling; cap refill normal; ankle normal; pain in midfoot with weight bearing  Neurological: She is alert and oriented to person, place, and time.  Skin: Skin is warm and dry.  Psychiatric: She has a normal mood and affect. Her behavior is normal.     [facility] reading (PRIMARY) by  Dr. Katrinka Blazing - avulsion of third metatarsal; ? fx at first tarsal/metatarsal junction       Assessment & Plan:    Metatarsal fracture, left, with routine healing, subsequent encounter - Plan: DG Foot Complete Left   Ariana Maldonado is a very pleasant 67 yr old female here for follow up on 3rd metatarsal avulsion fractures.  She is 7 days s/p injury to the left foot.  She is improved today with a decrease in swelling and some decrease in pain.  Though she is still quite bruised and does have pain with weight bearing.  Repeat x-rays today do not indicate any other fracture.  Will continue CAM walker and minimal weight bearing.  May continue icing for comfort.  Advil for pain/inflammation.  Elevate when possible.  Encouraged pt to begin gentle ankle circles/pumps to help relieve some of the edema.  Recheck 1 wk, sooner if concerns.    Forms for handicapped placard completed.

## 2012-10-09 ENCOUNTER — Ambulatory Visit (INDEPENDENT_AMBULATORY_CARE_PROVIDER_SITE_OTHER): Payer: BC Managed Care – PPO | Admitting: Internal Medicine

## 2012-10-09 ENCOUNTER — Ambulatory Visit: Payer: BC Managed Care – PPO

## 2012-10-09 VITALS — BP 140/82 | HR 87 | Temp 98.2°F | Resp 16

## 2012-10-09 DIAGNOSIS — S8290XD Unspecified fracture of unspecified lower leg, subsequent encounter for closed fracture with routine healing: Secondary | ICD-10-CM

## 2012-10-09 DIAGNOSIS — M25572 Pain in left ankle and joints of left foot: Secondary | ICD-10-CM

## 2012-10-09 DIAGNOSIS — M25579 Pain in unspecified ankle and joints of unspecified foot: Secondary | ICD-10-CM

## 2012-10-09 DIAGNOSIS — S92302D Fracture of unspecified metatarsal bone(s), left foot, subsequent encounter for fracture with routine healing: Secondary | ICD-10-CM

## 2012-10-09 IMAGING — CR DG ANKLE COMPLETE 3+V*L*
3 series · 3 of 3 positions shown · non-contrast
Comparison: Left foot radiographs [DATE] and earlier.

CLINICAL DATA: 67-year-old female with pain.

LEFT ANKLE COMPLETE - 3+ VIEW

[AP]
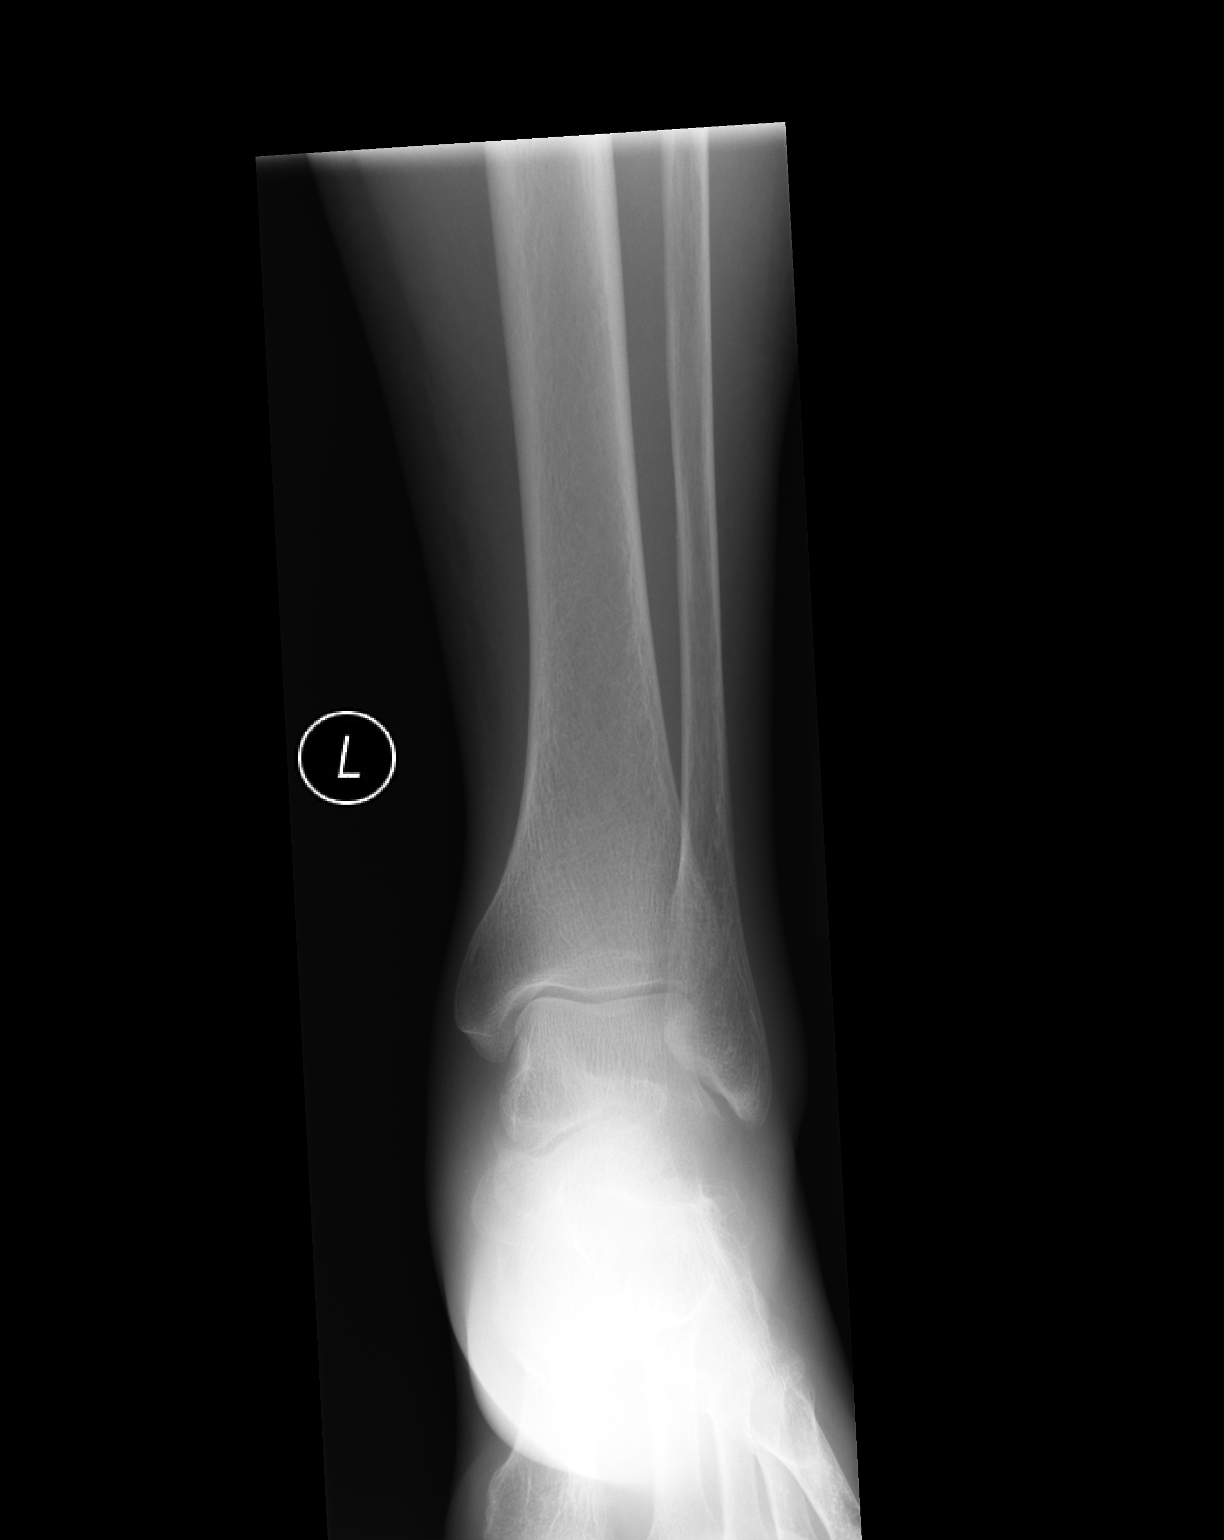

[ap obl int rot]
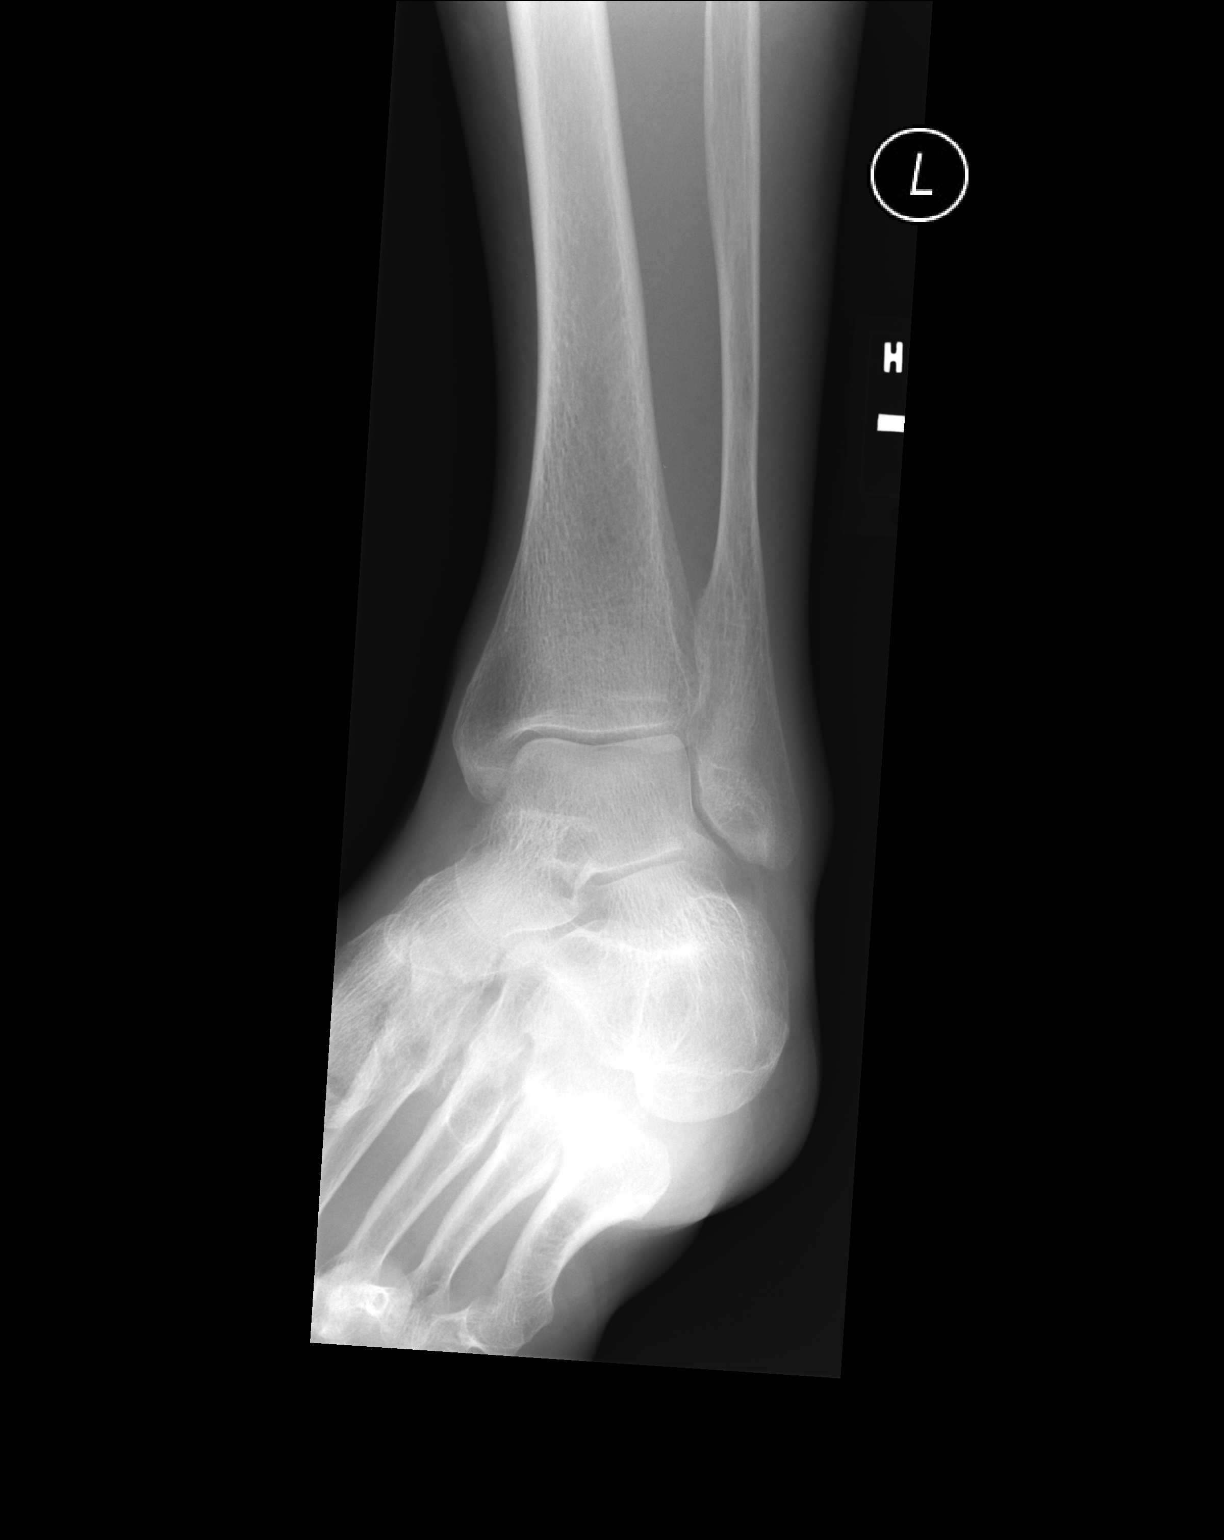

[lateral]
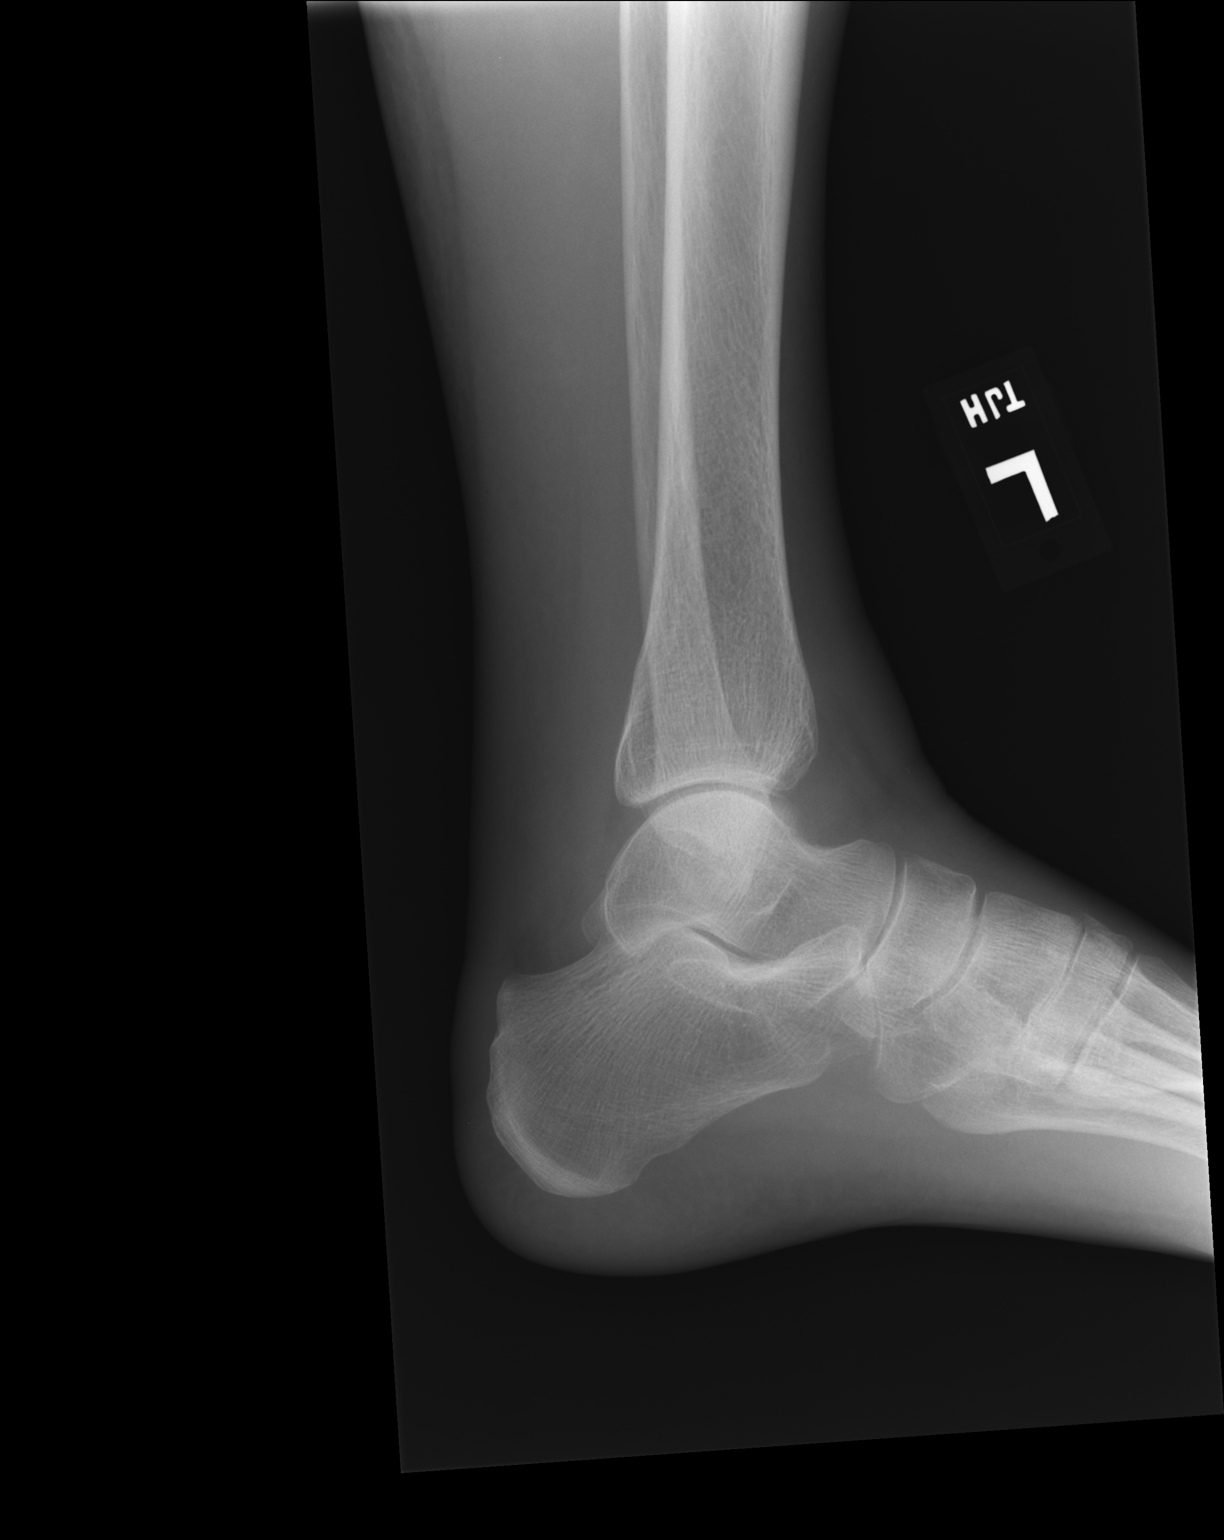

[3 of 3 positions shown; findings below may reference images not displayed]

FINDINGS: Possible joint effusion.  Mortise joint alignment
preserved.  Talar dome intact.  Distal tibia and fibula intact.
Calcaneus intact.  Proximal third metatarsal better evaluated on
the comparison.
IMPRESSION: 1. No acute fracture or dislocation identified about the left
ankle.
2.  Possible ankle joint effusion.
3.  Third metatarsal better evaluated on comparisons.

Clinically significant discrepancy from primary report, if
provided: None

## 2012-10-09 NOTE — Progress Notes (Signed)
  Subjective:    Patient ID: Ariana Maldonado, female    DOB: March 14, 1946, 67 y.o.   MRN: [id number]  HPI   Ariana Maldonado is a very pleasant 67 yr old female here for follow up on metatarsal fracture.  See previous notes for details.  Pt states she is improving.  Still with swelling and bruising.  Still with pain on full weight bearing.  Continues to ice and elevate when possible.  Now with some tightness pain in ankle/achilles, which she attributes to not being able to stretch in the boot.  Does feel like she is slowly improving.   Review of Systems  Constitutional: Negative for fever and chills.  HENT: Negative.   Respiratory: Negative.   Cardiovascular: Negative.   Gastrointestinal: Negative.   Musculoskeletal: Positive for arthralgias (left foot, ankle).  Skin: Negative.   Neurological: Negative.        Objective:   Physical Exam  Vitals reviewed. Constitutional: She is oriented to person, place, and time. She appears well-developed and well-nourished. No distress.  HENT:  Head: Normocephalic and atraumatic.  Eyes: Conjunctivae are normal. No scleral icterus.  Pulmonary/Chest: Effort normal.  Musculoskeletal:       Right ankle: Normal.       Left ankle: She exhibits swelling. Tenderness. Lateral malleolus tenderness found. Achilles tendon normal.       Left foot: She exhibits decreased range of motion, tenderness and swelling.       Feet:  Left foot still with moderate edema; ecchymosis along arch and plantar aspect of foot as well as 3rd and 4th toes (blue in graphic) - ecchymosis improved from 1 wk ago; still TTP over 3rd-5th metatarsals; some TTP now over lateral malleolus (red in graphic)  Neurological: She is alert and oriented to person, place, and time.  Skin: Skin is warm and dry.  Psychiatric: She has a normal mood and affect. Her behavior is normal.     [facility] reading (PRIMARY) by  Dr. Perrin Maltese - no fracture       Assessment & Plan:  Metatarsal fracture, left, with  routine healing, subsequent encounter  Pain in joint, ankle and foot, left - Plan: DG Ankle Complete Left   Ariana Maldonado is a very pleasant 67 yr old female with healing metatarsal avulsion fracture.  Still with swelling and bruising, though this continues to improve.  Now with some pain of lateral malleolus, ankle x-ray today is neg for fx.  Will continue CAM walker, minimal weight bearing.  Ice and elevate when possible.  Follow up in 2 wks, sooner if concerns arise.

## 2012-10-15 ENCOUNTER — Ambulatory Visit (INDEPENDENT_AMBULATORY_CARE_PROVIDER_SITE_OTHER): Payer: BC Managed Care – PPO | Admitting: Family Medicine

## 2012-10-15 VITALS — BP 147/83 | HR 88 | Temp 98.3°F | Resp 16

## 2012-10-15 DIAGNOSIS — L0291 Cutaneous abscess, unspecified: Secondary | ICD-10-CM

## 2012-10-15 DIAGNOSIS — L0390 Cellulitis, unspecified: Secondary | ICD-10-CM

## 2012-10-15 MED ORDER — DOXYCYCLINE HYCLATE 100 MG PO TABS: 100.0000 mg | ORAL_TABLET | Freq: Two times a day (BID) | ORAL | Status: DC

## 2012-10-15 NOTE — Progress Notes (Signed)
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 She has a several day history of progressive right parascapular swelling and tenderness. Her son has history of MRSA.  No nausea vomiting, no fever  Objective: 1-2 cm raised red indurated area in the right para-Vertebral region of her back around T10. This was outlined with an indelible marker in case patient comes back. There is no drainage or palpable fluctuance.  Assessment: Cellulitis, probably staph  Plan: Cellulitis - Plan: doxycycline (VIBRA-TABS) 100 MG tablet  Signed, Elvina Sidle, MD

## 2012-10-15 NOTE — Patient Instructions (Signed)
Cellulitis Cellulitis is an infection of the skin and the tissue beneath it. The infected area is usually red and tender. Cellulitis occurs most often in the arms and lower legs.  CAUSES  Cellulitis is caused by bacteria that enter the skin through cracks or cuts in the skin. The most common types of bacteria that cause cellulitis are Staphylococcus and Streptococcus. SYMPTOMS   Redness and warmth.  Swelling.  Tenderness or pain.  Fever. DIAGNOSIS  Your caregiver can usually determine what is wrong based on a physical exam. Blood tests may also be done. TREATMENT  Treatment usually involves taking an antibiotic medicine. HOME CARE INSTRUCTIONS   Take your antibiotics as directed. Finish them even if you start to feel better.  Keep the infected arm or leg elevated to reduce swelling.  Apply a warm cloth to the affected area up to 4 times per day to relieve pain.  Only take over-the-counter or prescription medicines for pain, discomfort, or fever as directed by your caregiver.  Keep all follow-up appointments as directed by your caregiver. SEEK MEDICAL CARE IF:   You notice red streaks coming from the infected area.  Your red area gets larger or turns dark in color.  Your bone or joint underneath the infected area becomes painful after the skin has healed.  Your infection returns in the same area or another area.  You notice a swollen bump in the infected area.  You develop new symptoms. SEEK IMMEDIATE MEDICAL CARE IF:   You have a fever.  You feel very sleepy.  You develop vomiting or diarrhea.  You have a general ill feeling (malaise) with muscle aches and pains. MAKE SURE YOU:   Understand these instructions.  Will watch your condition.  Will get help right away if you are not doing well or get worse. Document Released: 01/20/2005 Document Revised: 10/12/2011 Document Reviewed: 06/28/2011 [employer] Patient Information 2014 [employer].  

## 2012-10-17 ENCOUNTER — Ambulatory Visit (INDEPENDENT_AMBULATORY_CARE_PROVIDER_SITE_OTHER): Payer: BC Managed Care – PPO | Admitting: Internal Medicine

## 2012-10-17 VITALS — BP 138/78 | HR 98 | Temp 98.1°F | Resp 18 | Ht 63.0 in | Wt 111.0 lb

## 2012-10-17 DIAGNOSIS — S21201D Unspecified open wound of right back wall of thorax without penetration into thoracic cavity, subsequent encounter: Secondary | ICD-10-CM

## 2012-10-17 DIAGNOSIS — Z5189 Encounter for other specified aftercare: Secondary | ICD-10-CM

## 2012-10-17 DIAGNOSIS — Z23 Encounter for immunization: Secondary | ICD-10-CM

## 2012-10-17 DIAGNOSIS — S99919A Unspecified injury of unspecified ankle, initial encounter: Secondary | ICD-10-CM

## 2012-10-17 DIAGNOSIS — S99929A Unspecified injury of unspecified foot, initial encounter: Secondary | ICD-10-CM

## 2012-10-17 NOTE — Progress Notes (Signed)
  Subjective:    Patient ID: Ariana Maldonado, female    DOB: 25-Dec-1945, 67 y.o.   MRN: [id number]  HPI  67 YO female patient comes in today for a follow up in regards to a wound that is located on her back. She was here on Sunday and prescribed Doxycycline. She has continued to take this medication. She states the area of the wound is getting larger. She has also been in pain. She was not able to get to sleep last night. She took ibuprofen. Patient has not noticed any drainage. She has used a few hot compresses.  She is very concerned that this is a MRSA infection. She states it runs in her family. She is concerned with its location that it might go into her spine.   She is also here to follow up on her left foot fracture. She is wearing her foot brace. She states it is feeling better. She still wears the boot due to the swelling. She is unable to fit into her regular shoes. She states that if she keeps it elevated it does not swell. Pt works 2 jobs and does not find the time keep it in that position. She has been putting full weight on her foot and at home she does not wear the boot.  Review of Systems     Objective:   Physical Exam  Vitals reviewed. Constitutional: She appears well-developed and well-nourished.  Neck: Neck supple.  Cardiovascular: Normal rate.   Musculoskeletal:       Left foot: She exhibits tenderness, swelling and laceration. She exhibits normal range of motion, no crepitus and no deformity.  Improving nmsv intact Using cam most of time  Lymphadenopathy:    She has axillary adenopathy.       Left axillary: Lateral adenopathy present.  Skin: Lesion and rash noted. Rash is pustular. There is erythema.      Large fluctuant red mass mid back. Pustule present  ID by Weber PAc     Assessment & Plan:  Abscess/cellulitis thorax Continue doxycycline/wound care/Tdap update RTC 3 days

## 2012-10-17 NOTE — Progress Notes (Signed)
Procedure:  Consent obtained.  1% lido with epi used for local anesthesia.  #11 blade used to make a horizontal 1cm incision through the pore of the abscess.  Some purulence expressed but it came from the medial upper aspect of the abscess.  Wound was packed with 1/4 in plain packing and drsg was placed.

## 2012-10-17 NOTE — Patient Instructions (Addendum)
Keep clean and dry, change drsg daily but keep packing in place.  Recheck in 48h for packing change.

## 2012-10-17 NOTE — Progress Notes (Signed)
  Subjective:    Patient ID: Ariana Maldonado, female    DOB: 12/06/1945, 67 y.o.   MRN: [id number]  HPI    Review of Systems     Objective:   Physical Exam        Assessment & Plan:

## 2012-10-19 ENCOUNTER — Ambulatory Visit (INDEPENDENT_AMBULATORY_CARE_PROVIDER_SITE_OTHER): Payer: BC Managed Care – PPO | Admitting: Internal Medicine

## 2012-10-19 VITALS — BP 138/80 | HR 106 | Temp 98.0°F | Resp 17 | Ht 63.5 in | Wt 113.0 lb

## 2012-10-19 DIAGNOSIS — L0291 Cutaneous abscess, unspecified: Secondary | ICD-10-CM

## 2012-10-19 DIAGNOSIS — M549 Dorsalgia, unspecified: Secondary | ICD-10-CM

## 2012-10-19 NOTE — Progress Notes (Signed)
  Subjective:    Patient ID: Ariana Maldonado, female    DOB: February 20, 1946, 67 y.o.   MRN: [id number]  HPI  Healing abscess, cellulitis. Less pain, still draining a lot. Phx of mrsa. Culture pending.  Review of Systems     Objective:   Physical Exam Packing removed and all improved Repacked by PAc Dunn       Assessment & Plan:  Wound care Counseled

## 2012-10-19 NOTE — Progress Notes (Signed)
    PROCEDURE: Dressing and packing removed. No purulence expressed Wound bed healthy Irrigated with 1% plain lidocaine 5 cc. Repacked with 1/4 inch plain packing Dressing applied  LAB: Culture: Staph  A/P: 67 y.o. female with cellulitis/abscess as above s/p I&D on 10/17/12 -Wound care per above -Continue Doxycycline -Pain well controlled -Daily dressing changes -Recheck 48 hours  Signed, Eula Listen, PA-C 10/19/2012 12:35 PM

## 2012-10-20 LAB — WOUND CULTURE: Gram Stain: NONE SEEN

## 2012-10-22 ENCOUNTER — Ambulatory Visit (INDEPENDENT_AMBULATORY_CARE_PROVIDER_SITE_OTHER): Payer: BC Managed Care – PPO | Admitting: Physician Assistant

## 2012-10-22 VITALS — BP 112/70 | HR 81 | Temp 98.0°F | Resp 17 | Ht 64.0 in | Wt 106.0 lb

## 2012-10-22 DIAGNOSIS — B9562 Methicillin resistant Staphylococcus aureus infection as the cause of diseases classified elsewhere: Secondary | ICD-10-CM

## 2012-10-22 DIAGNOSIS — L089 Local infection of the skin and subcutaneous tissue, unspecified: Secondary | ICD-10-CM

## 2012-10-22 DIAGNOSIS — L723 Sebaceous cyst: Secondary | ICD-10-CM

## 2012-10-22 DIAGNOSIS — L0291 Cutaneous abscess, unspecified: Secondary | ICD-10-CM

## 2012-10-22 NOTE — Progress Notes (Signed)
   [address], [place] [address]   Phone [phone]  Subjective:    Patient ID: Ariana Maldonado, female    DOB: 11-05-1945, 67 y.o.   MRN: [id number]  HPI Pt presents to clinic for wound recheck from I&D infected sebaceous cyst on 6/24.  She is doing well and tolerating the abx ok.  She has been changing the drsg daily.   Review of Systems  Constitutional: Negative for fever and chills.  Skin: Positive for wound.       Objective:   Physical Exam  Vitals reviewed. Constitutional: She is oriented to person, place, and time. She appears well-developed and well-nourished.  HENT:  Head: Normocephalic and atraumatic.  Right Ear: External ear normal.  Left Ear: External ear normal.  Eyes: Conjunctivae are normal.  Pulmonary/Chest: Effort normal.  Neurological: She is alert and oriented to person, place, and time.  Skin: Skin is warm and dry.  Drsg and packing removed.  No surrounding erythema. No purulence expressed from wound.  Irrigated with 1% lido and repacked with 1/4 in plain packing.  Drsg placed.  Psychiatric: She has a normal mood and affect. Her behavior is normal. Judgment and thought content normal.        Assessment & Plan:  Infected sebaceous cyst-doing much better from her I&D, she will continue the abx and recheck in 3 days.  Change drsg daily.  MRSA cellulitis - on correct abx  Benny Lennert [facility] 10/22/2012 4:22 PM

## 2012-10-25 ENCOUNTER — Ambulatory Visit (INDEPENDENT_AMBULATORY_CARE_PROVIDER_SITE_OTHER): Payer: BC Managed Care – PPO | Admitting: Physician Assistant

## 2012-10-25 VITALS — BP 132/84 | HR 88 | Temp 98.1°F | Resp 18 | Ht 64.0 in | Wt 106.0 lb

## 2012-10-25 DIAGNOSIS — L0390 Cellulitis, unspecified: Secondary | ICD-10-CM

## 2012-10-25 DIAGNOSIS — L0291 Cutaneous abscess, unspecified: Secondary | ICD-10-CM

## 2012-10-25 DIAGNOSIS — B9562 Methicillin resistant Staphylococcus aureus infection as the cause of diseases classified elsewhere: Secondary | ICD-10-CM

## 2012-10-25 MED ORDER — DOXYCYCLINE HYCLATE 100 MG PO TABS: 100.0000 mg | ORAL_TABLET | Freq: Two times a day (BID) | ORAL | Status: DC

## 2012-10-25 NOTE — Progress Notes (Signed)
   [address], [place] [address]   Phone [phone]  Subjective:    Patient ID: Ariana Maldonado, female    DOB: 1945/08/01, 68 y.o.   MRN: [id number]  HPI Pt presents to clinic for wound recheck from I&D on 10/17/2012.  She finished her abx yesterday but is Very nervous about not being on abx and not being healed.  She is having almost no pain associated with the wound.    Review of Systems  Constitutional: Negative for fever and chills.  Gastrointestinal: Negative for nausea.  Skin: Positive for wound. Negative for rash.       Objective:   Physical Exam  Vitals reviewed. Constitutional: She appears well-developed and well-nourished.  Pulmonary/Chest: Effort normal.  Skin: Skin is warm and dry.  Drsg and packing removed.  No purulence expressed.  Wound about 3/4cm deep and 1/2 cm wound opening - will pack today with 1/4 in plain packing.  Psychiatric: She has a normal mood and affect. Her behavior is normal. Judgment and thought content normal.       Assessment & Plan:  Cellulitis - Plan: doxycycline (VIBRA-TABS) 100 MG tablet MRSA cellulitis -   Pt is very nervous about her MRSA - will do an additional 7d of treatment - change dressing daily  Recheck in 4 days for packing change  Benny Lennert PA-C 10/25/2012 10:04 AM

## 2012-10-29 ENCOUNTER — Ambulatory Visit (INDEPENDENT_AMBULATORY_CARE_PROVIDER_SITE_OTHER): Payer: BC Managed Care – PPO | Admitting: Family Medicine

## 2012-10-29 VITALS — BP 126/68 | HR 82 | Temp 97.9°F | Resp 18 | Ht 63.5 in | Wt 110.0 lb

## 2012-10-29 DIAGNOSIS — M40204 Unspecified kyphosis, thoracic region: Secondary | ICD-10-CM

## 2012-10-29 DIAGNOSIS — L0291 Cutaneous abscess, unspecified: Secondary | ICD-10-CM

## 2012-10-29 DIAGNOSIS — L0390 Cellulitis, unspecified: Secondary | ICD-10-CM

## 2012-10-29 DIAGNOSIS — M4000 Postural kyphosis, site unspecified: Secondary | ICD-10-CM

## 2012-10-29 MED ORDER — DOXYCYCLINE HYCLATE 100 MG PO TABS: 100.0000 mg | ORAL_TABLET | Freq: Two times a day (BID) | ORAL | Status: AC

## 2012-10-29 NOTE — Progress Notes (Signed)
2 wks of thoracic abscess dx'd as MRSA.  Here for recheck  Objective:  NAD Moderate kyphosis Packing removed leaving clean base and no surrounding cellulitis.  No induration or significant swelling  Assessment:  Thoracic kyphosis  Plan:  1 more week of doxycycline Bone density  Signed, Elvina Sidle, MD

## 2012-11-16 NOTE — Progress Notes (Signed)
History and physical exam reviewed in detail with Frances Furbish, PA-C.  Foot film reviewed in detail. Agree with current assessment and plan.

## 2013-06-17 ENCOUNTER — Ambulatory Visit (INDEPENDENT_AMBULATORY_CARE_PROVIDER_SITE_OTHER): Payer: BC Managed Care – PPO | Admitting: Internal Medicine

## 2013-06-17 VITALS — BP 140/80 | HR 108 | Temp 99.8°F | Resp 16 | Ht 63.5 in | Wt 112.0 lb

## 2013-06-17 DIAGNOSIS — J019 Acute sinusitis, unspecified: Secondary | ICD-10-CM

## 2013-06-17 MED ORDER — HYDROCODONE-HOMATROPINE 5-1.5 MG/5ML PO SYRP: 5.0000 mL | ORAL_SOLUTION | Freq: Four times a day (QID) | ORAL | Status: DC | PRN

## 2013-06-17 MED ORDER — LISINOPRIL-HYDROCHLOROTHIAZIDE 10-12.5 MG PO TABS: ORAL_TABLET | ORAL | Status: DC

## 2013-06-17 MED ORDER — AMOXICILLIN 875 MG PO TABS: 875.0000 mg | ORAL_TABLET | Freq: Two times a day (BID) | ORAL | Status: DC

## 2013-06-17 NOTE — Progress Notes (Signed)
   Subjective:    Patient ID: Ariana Maldonado, female    DOB: 11/20/1945, 68 y.o.   MRN: [id number]  HPI Chief Complaint  Patient presents with  . URI    x 1 week  . Medication Refill    lisinopril   This chart was scribed for Ellamae Siaobert Nakaya Mishkin, MD by Andrew Auaven Small, ED Scribe. This patient was seen in room 3 and the patient's care was started at 3:42 PM.  HPI Comments: Ariana Maldonado is a 68 y.o. female who presents to the [facility] complaining of worsening cold symptoms onset 1 week. She reports that she wake up in the middle of night due to a productive cough. She reports an associated sore throat and mild fever. Pt states that her voice has been horse.  Pt is also requesting a medication refill of lisinopril. Pt states that she ran out while on a trip and that Dr. Perrin MalteseGuest usually prescribes her medication.  Pt work at [employer] and [employer]  Past Medical History  Diagnosis Date  . Hypertension    Allergies  Allergen Reactions  . Sulfonamide Derivatives     REACTION: hive   Prior to Admission medications   Medication Sig Start Date End Date Taking? Authorizing Provider  lisinopril-hydrochlorothiazide (PRINZIDE,ZESTORETIC) 10-12.5 MG per tablet TAKE 1 TABLET BY MOUTH DAILY 03/31/12  Yes Nelva NayHeather M Marte, PA-C   Review of Systems  Constitutional: Positive for fever.  HENT: Positive for congestion and sore throat.   Respiratory: Positive for cough.       Objective:   Physical Exam  Nursing note and vitals reviewed. Constitutional: She is oriented to person, place, and time. She appears well-developed and well-nourished. No distress.  HENT:  Head: Normocephalic and atraumatic.  Right Ear: External ear normal.  Left Ear: External ear normal.  Mouth/Throat: Oropharynx is clear and moist.  Purulent discharge from nares/tender maxillary areas bilaterally  Eyes: Conjunctivae and EOM are normal. Pupils are equal, round, and reactive to light.  Neck:  Neck supple. No thyromegaly present.  Cardiovascular: Normal rate, regular rhythm, normal heart sounds and intact distal pulses.   No murmur heard. Pulmonary/Chest: Effort normal and breath sounds normal. No respiratory distress.  Musculoskeletal: Normal range of motion.  Lymphadenopathy:    She has no cervical adenopathy.  Neurological: She is alert and oriented to person, place, and time.  Skin: Skin is warm and dry.  Psychiatric: She has a normal mood and affect. Her behavior is normal.      Assessment & Plan:  Acute sinusitis, unspecified  cough  Med refill for hypertension--followup as planned for exam and labs Meds ordered this encounter  Medications  . lisinopril-hydrochlorothiazide (PRINZIDE,ZESTORETIC) 10-12.5 MG per tablet    Sig: TAKE 1 TABLET BY MOUTH DAILY    Dispense:  90 tablet    Refill:  1  . amoxicillin (AMOXIL) 875 MG tablet    Sig: Take 1 tablet (875 mg total) by mouth 2 (two) times daily.    Dispense:  20 tablet    Refill:  0  . HYDROcodone-homatropine (HYCODAN) 5-1.5 MG/5ML syrup    Sig: Take 5 mLs by mouth every 6 (six) hours as needed for cough.    Dispense:  120 mL    Refill:  0    I have completed the patient encounter in its entirety as documented by the scribe, with editing by me where necessary. Dominga Mcduffie P. Merla Richesoolittle, M.D.

## 2015-01-24 ENCOUNTER — Ambulatory Visit (INDEPENDENT_AMBULATORY_CARE_PROVIDER_SITE_OTHER): Payer: BC Managed Care – PPO | Admitting: Family Medicine

## 2015-01-24 VITALS — BP 128/86 | HR 86 | Temp 98.2°F | Resp 18 | Ht 63.0 in | Wt 114.6 lb

## 2015-01-24 DIAGNOSIS — J069 Acute upper respiratory infection, unspecified: Secondary | ICD-10-CM

## 2015-01-24 DIAGNOSIS — I10 Essential (primary) hypertension: Secondary | ICD-10-CM

## 2015-01-24 DIAGNOSIS — J209 Acute bronchitis, unspecified: Secondary | ICD-10-CM | POA: Diagnosis not present

## 2015-01-24 MED ORDER — AZITHROMYCIN 250 MG PO TABS: ORAL_TABLET | ORAL | Status: DC

## 2015-01-24 MED ORDER — LISINOPRIL-HYDROCHLOROTHIAZIDE 20-12.5 MG PO TABS: 1.0000 | ORAL_TABLET | Freq: Every day | ORAL | Status: DC

## 2015-01-24 NOTE — Progress Notes (Signed)
Subjective:  This chart was scribed for Norberto Sorenson, MD by Andrew Au, ED Scribe. This patient was seen in room 5 and the patient's care was started at 1:54 PM.  Patient ID: Ariana Maldonado, female    DOB: Jun 13, 1945, 69 y.o.   MRN: [id number]  HPI Chief Complaint  Patient presents with  . URI    x1 on and off, runny nose, coughing. No sore throat. Non productive cough.     HPI Comments: Ariana Maldonado is a 69 y.o. female who presents to the [facility] complaining of cough and rhinorrhea. Pt states she's had a could for over a month. States it getting better for a couple weeks but recently returning with nasal congestion with green mucous, occasional productive cough consisting of phlegm and some sinus pressure when bending forward. She has not recently tried OTC medication but was taking advil cold and sinus for symptoms 1 month ago. She had been drinking plenty of fluids, staying hydrated, and eating well. Denies sore throat. No sick contacts.   She is con lisinopril- HCTZ. Pt checks BP at home but states it has been elevated. States when she puts on the cuff at home she can feel herself becoming anxious. She is needing refills of medication. She has occasional cramping in her feet but denies chronic cough and urinary frequency.   Past Medical History  Diagnosis Date  . Hypertension    Allergies  Allergen Reactions  . Sulfonamide Derivatives     REACTION: hive   Prior to Admission medications   Medication Sig Start Date End Date Taking? Authorizing Provider  lisinopril-hydrochlorothiazide (PRINZIDE,ZESTORETIC) 10-12.5 MG per tablet TAKE 1 TABLET BY MOUTH DAILY 06/17/13  Yes Tonye Pearson, MD  amoxicillin (AMOXIL) 875 MG tablet Take 1 tablet (875 mg total) by mouth 2 (two) times daily. Patient not taking: Reported on 01/24/2015 06/17/13   Tonye Pearson, MD  HYDROcodone-homatropine [facility]) 5-1.5 MG/5ML syrup Take 5 mLs by mouth every 6 (six) hours as  needed for cough. Patient not taking: Reported on 01/24/2015 06/17/13   Tonye Pearson, MD   Review of Systems  Constitutional: Negative for fever, chills and diaphoresis.  HENT: Positive for congestion and sinus pressure. Negative for ear pain, rhinorrhea, sore throat and trouble swallowing.   Respiratory: Positive for cough.   Genitourinary: Negative for frequency and difficulty urinating.  Musculoskeletal: Negative for myalgias.    Objective:   Physical Exam  Constitutional: She is oriented to person, place, and time. She appears well-developed and well-nourished. No distress.  HENT:  Head: Normocephalic and atraumatic.  Right Ear: Hearing, external ear and ear canal normal.  Left Ear: Hearing, tympanic membrane, external ear and ear canal normal.  Nose: Nose normal.  Mouth/Throat: Oropharynx is clear and moist. No oropharyngeal exudate, posterior oropharyngeal edema or posterior oropharyngeal erythema.  Eyes: Conjunctivae and EOM are normal.  Neck: Neck supple. No thyromegaly present.  Cardiovascular: Normal rate, regular rhythm, S1 normal, S2 normal and normal heart sounds.  Exam reveals no gallop and no friction rub.   No murmur heard. Pulmonary/Chest: Effort normal. No respiratory distress. She has no wheezes. She has no rales. She exhibits no tenderness.  Coarse breath sounds throughout. Lungs cleared up with recurrent inhalation  Musculoskeletal: Normal range of motion.  Lymphadenopathy:    She has cervical adenopathy ( anterior, right worse than left).  Neurological: She is alert and oriented to person, place, and time.  Skin: Skin is warm and dry.  Psychiatric: She has a normal mood and affect. Her behavior is normal.  Nursing note and vitals reviewed.  Filed Vitals:   01/24/15 1345  BP: 128/86  Pulse: 86  Temp: 98.2 F (36.8 C)  TempSrc: Oral  Resp: 18  Height:  (1.6 m)  Weight: 114 lb 9.6 oz (51.982 kg)  SpO2: 99%   Assessment & Plan:   1. Acute  bronchitis, unspecified organism   2. Acute URI   3. Essential hypertension    Possible bacterial bronchitis will treat with 5 day coarse of Zpak. Pt has been obtaining elevated BP readings outside of office.  Increased lisinopril-HCTZ to 20-12.5mg  once a day. Cont to monitor.  Meds ordered this encounter  Medications  . azithromycin (ZITHROMAX) 250 MG tablet    Sig: Take 2 tabs PO x 1 dose, then 1 tab PO QD x 4 days    Dispense:  6 tablet    Refill:  0  . lisinopril-hydrochlorothiazide (PRINZIDE,ZESTORETIC) 20-12.5 MG tablet    Sig: Take 1 tablet by mouth daily.    Dispense:  90 tablet    Refill:  1   I personally performed the services described in this documentation, which was scribed in my presence. The recorded information has been reviewed and considered, and addended by me as needed.  Norberto Sorenson, MD MPH  By signing my name below, I, Raven Small, attest that this documentation has been prepared under the direction and in the presence of Norberto Sorenson, MD.  Electronically Signed: Andrew Au, ED Scribe. 01/24/2015. 2:09 PM.

## 2015-01-24 NOTE — Patient Instructions (Addendum)
You will need a visit to recheck your blood pressure and get lab work prior to BP medication refills. If you want to schedule this as a full physical that would be the most cost-effective option - but go ahead and schedule now - appointments book up fast.  Make sure you are drinking plenty of water and getting potassium in your diet (but do not take a potassium supplement). Acute Bronchitis Bronchitis is inflammation of the airways that extend from the windpipe into the lungs (bronchi). The inflammation often causes mucus to develop. This leads to a cough, which is the most common symptom of bronchitis.  In acute bronchitis, the condition usually develops suddenly and goes away over time, usually in a couple weeks. Smoking, allergies, and asthma can make bronchitis worse. Repeated episodes of bronchitis may cause further lung problems.  CAUSES Acute bronchitis is most often caused by the same virus that causes a cold. The virus can spread from person to person (contagious) through coughing, sneezing, and touching contaminated objects. SIGNS AND SYMPTOMS   Cough.   Fever.   Coughing up mucus.   Body aches.   Chest congestion.   Chills.   Shortness of breath.   Sore throat.  DIAGNOSIS  Acute bronchitis is usually diagnosed through a physical exam. Your health care provider will also ask you questions about your medical history. Tests, such as chest X-rays, are sometimes done to rule out other conditions.  TREATMENT  Acute bronchitis usually goes away in a couple weeks. Oftentimes, no medical treatment is necessary. Medicines are sometimes given for relief of fever or cough. Antibiotic medicines are usually not needed but may be prescribed in certain situations. In some cases, an inhaler may be recommended to help reduce shortness of breath and control the cough. A cool mist vaporizer may also be used to help thin bronchial secretions and make it easier to clear the chest.  HOME CARE  INSTRUCTIONS  Get plenty of rest.   Drink enough fluids to keep your urine clear or pale yellow (unless you have a medical condition that requires fluid restriction). Increasing fluids may help thin your respiratory secretions (sputum) and reduce chest congestion, and it will prevent dehydration.   Take medicines only as directed by your health care provider.  If you were prescribed an antibiotic medicine, finish it all even if you start to feel better.  Avoid smoking and secondhand smoke. Exposure to cigarette smoke or irritating chemicals will make bronchitis worse. If you are a smoker, consider using nicotine gum or skin patches to help control withdrawal symptoms. Quitting smoking will help your lungs heal faster.   Reduce the chances of another bout of acute bronchitis by washing your hands frequently, avoiding people with cold symptoms, and trying not to touch your hands to your mouth, nose, or eyes.   Keep all follow-up visits as directed by your health care provider.  SEEK MEDICAL CARE IF: Your symptoms do not improve after 1 week of treatment.  SEEK IMMEDIATE MEDICAL CARE IF:  You develop an increased fever or chills.   You have chest pain.   You have severe shortness of breath.  You have bloody sputum.   You develop dehydration.  You faint or repeatedly feel like you are going to pass out.  You develop repeated vomiting.  You develop a severe headache. MAKE SURE YOU:   Understand these instructions.  Will watch your condition.  Will get help right away if you are not doing well or  get worse. Document Released: 05/20/2004 Document Revised: 08/27/2013 Document Reviewed: 10/03/2012 [facility] Patient Information 2015 [place], [place]. This information is not intended to replace advice given to you by your health care provider. Make sure you discuss any questions you have with your health care provider. Potassium Content of Foods Potassium is a mineral found in  many foods and drinks. It helps keep fluids and minerals balanced in your body and affects how steadily your heart beats. Potassium also helps control your blood pressure and keep your muscles and nervous system healthy. Certain health conditions and medicines may change the balance of potassium in your body. When this happens, you can help balance your level of potassium through the foods that you do or do not eat. Your health care provider or dietitian may recommend an amount of potassium that you should have each day. The following lists of foods provide the amount of potassium (in parentheses) per serving in each item. HIGH IN POTASSIUM  The following foods and beverages have 200 mg or more of potassium per serving:  Apricots, 2 raw or 5 dry (200 mg).  Artichoke, 1 medium (345 mg).  Avocado, raw,  each (245 mg).  Banana, 1 medium (425 mg).  Beans, lima, or baked beans, canned,  cup (280 mg).  Beans, white, canned,  cup (595 mg).  Beef roast, 3 oz (320 mg).  Beef, ground, 3 oz (270 mg).  Beets, raw or cooked,  cup (260 mg).  Bran muffin, 2 oz (300 mg).  Broccoli,  cup (230 mg).  Brussels sprouts,  cup (250 mg).  Cantaloupe,  cup (215 mg).  Cereal, 100% bran,  cup (200-400 mg).  Cheeseburger, single, fast food, 1 each (225-400 mg).  Chicken, 3 oz (220 mg).  Clams, canned, 3 oz (535 mg).  Crab, 3 oz (225 mg).  Dates, 5 each (270 mg).  Dried beans and peas,  cup (300-475 mg).  Figs, dried, 2 each (260 mg).  Fish: halibut, tuna, cod, snapper, 3 oz (480 mg).  Fish: salmon, haddock, swordfish, perch, 3 oz (300 mg).  Fish, tuna, canned 3 oz (200 mg).  [place] fries, fast food, 3 oz (470 mg).  Granola with fruit and nuts,  cup (200 mg).  Grapefruit juice,  cup (200 mg).  Greens, beet,  cup (655 mg).  Honeydew melon,  cup (200 mg).  Kale, raw, 1 cup (300 mg).  Kiwi, 1 medium (240 mg).  Kohlrabi, rutabaga, parsnips,  cup (280 mg).  Lentils,   cup (365 mg).  Mango, 1 each (325 mg).  Milk, chocolate, 1 cup (420 mg).  Milk: nonfat, low-fat, whole, buttermilk, 1 cup (350-380 mg).  Molasses, 1 Tbsp (295 mg).  Mushrooms,  cup (280) mg.  Nectarine, 1 each (275 mg).  Nuts: almonds, peanuts, hazelnuts, [place], cashew, mixed, 1 oz (200 mg).  Nuts, pistachios, 1 oz (295 mg).  Orange, 1 each (240 mg).  Orange juice,  cup (235 mg).  Papaya, medium,  fruit (390 mg).  Peanut butter, chunky, 2 Tbsp (240 mg).  Peanut butter, smooth, 2 Tbsp (210 mg).  Pear, 1 medium (200 mg).  Pomegranate, 1 whole (400 mg).  Pomegranate juice,  cup (215 mg).  Pork, 3 oz (350 mg).  Potato chips, salted, 1 oz (465 mg).  Potato, baked with skin, 1 medium (925 mg).  Potatoes, boiled,  cup (255 mg).  Potatoes, mashed,  cup (330 mg).  Prune juice,  cup (370 mg).  Prunes, 5 each (305 mg).  Pudding, chocolate,  cup (230 mg).  Pumpkin, canned,  cup (250 mg).  Raisins, seedless,  cup (270 mg).  Seeds, sunflower or pumpkin, 1 oz (240 mg).  Soy milk, 1 cup (300 mg).  Spinach,  cup (420 mg).  Spinach, canned,  cup (370 mg).  Sweet potato, baked with skin, 1 medium (450 mg).  Swiss chard,  cup (480 mg).  Tomato or vegetable juice,  cup (275 mg).  Tomato sauce or puree,  cup (400-550 mg).  Tomato, raw, 1 medium (290 mg).  Tomatoes, canned,  cup (200-300 mg).  [place], 3 oz (250 mg).  Wheat germ, 1 oz (250 mg).  Winter squash,  cup (250 mg).  Yogurt, plain or fruited, 6 oz (260-435 mg).  Zucchini,  cup (220 mg). MODERATE IN POTASSIUM The following foods and beverages have 50-200 mg of potassium per serving:  Apple, 1 each (150 mg).  Apple juice,  cup (150 mg).  Applesauce,  cup (90 mg).  Apricot nectar,  cup (140 mg).  Asparagus, small spears,  cup or 6 spears (155 mg).  Bagel, cinnamon raisin, 1 each (130 mg).  Bagel, egg or plain, 4 in., 1 each (70 mg).  Beans, green,  cup (90  mg).  Beans, yellow,  cup (190 mg).  Beer, regular, 12 oz (100 mg).  Beets, canned,  cup (125 mg).  Blackberries,  cup (115 mg).  Blueberries,  cup (60 mg).  Bread, whole wheat, 1 slice (70 mg).  Broccoli, raw,  cup (145 mg).  Cabbage,  cup (150 mg).  Carrots, cooked or raw,  cup (180 mg).  Cauliflower, raw,  cup (150 mg).  Celery, raw,  cup (155 mg).  Cereal, bran flakes, cup (120-150 mg).  Cheese, cottage,  cup (110 mg).  Cherries, 10 each (150 mg).  Chocolate, 1 oz bar (165 mg).  Coffee, brewed 6 oz (90 mg).  Corn,  cup or 1 ear (195 mg).  Cucumbers,  cup (80 mg).  Egg, large, 1 each (60 mg).  Eggplant,  cup (60 mg).  Endive, raw, cup (80 mg).  English muffin, 1 each (65 mg).  Fish, orange roughy, 3 oz (150 mg).  Frankfurter, beef or pork, 1 each (75 mg).  Fruit cocktail,  cup (115 mg).  Grape juice,  cup (170 mg).  Grapefruit,  fruit (175 mg).  Grapes,  cup (155 mg).  Greens: kale, turnip, collard,  cup (110-150 mg).  Ice cream or frozen yogurt, chocolate,  cup (175 mg).  Ice cream or frozen yogurt, vanilla,  cup (120-150 mg).  Lemons, limes, 1 each (80 mg).  Lettuce, all types, 1 cup (100 mg).  Mixed vegetables,  cup (150 mg).  Mushrooms, raw,  cup (110 mg).  Nuts: walnuts, pecans, or macadamia, 1 oz (125 mg).  Oatmeal,  cup (80 mg).  Okra,  cup (110 mg).  Onions, raw,  cup (120 mg).  Peach, 1 each (185 mg).  Peaches, canned,  cup (120 mg).  Pears, canned,  cup (120 mg).  Peas, green, frozen,  cup (90 mg).  Peppers, green,  cup (130 mg).  Peppers, red,  cup (160 mg).  Pineapple juice,  cup (165 mg).  Pineapple, fresh or canned,  cup (100 mg).  Plums, 1 each (105 mg).  Pudding, vanilla,  cup (150 mg).  Raspberries,  cup (90 mg).  Rhubarb,  cup (115 mg).  Rice, wild,  cup (80 mg).  Shrimp, 3 oz (155 mg).  Spinach, raw, 1 cup (170 mg).  Strawberries,  cup (  125  mg).  Summer squash  cup (175-200 mg).  Swiss chard, raw, 1 cup (135 mg).  Tangerines, 1 each (140 mg).  Tea, brewed, 6 oz (65 mg).  Turnips,  cup (140 mg).  Watermelon,  cup (85 mg).  Wine, red, table, 5 oz (180 mg).  Wine, white, table, 5 oz (100 mg). LOW IN POTASSIUM The following foods and beverages have less than 50 mg of potassium per serving.  Bread, white, 1 slice (30 mg).  Carbonated beverages, 12 oz (less than 5 mg).  Cheese, 1 oz (20-30 mg).  Cranberries,  cup (45 mg).  Cranberry juice cocktail,  cup (20 mg).  Fats and oils, 1 Tbsp (less than 5 mg).  Hummus, 1 Tbsp (32 mg).  Nectar: papaya, mango, or pear,  cup (35 mg).  Rice, white or brown,  cup (50 mg).  Spaghetti or macaroni,  cup cooked (30 mg).  Tortilla, flour or corn, 1 each (50 mg).  Waffle, 4 in., 1 each (50 mg).  Water chestnuts,  cup (40 mg). Document Released: 11/24/2004 Document Revised: 04/17/2013 Document Reviewed: 03/09/2013 [facility] Patient Information 2015 [place], [place]. This information is not intended to replace advice given to you by your health care provider. Make sure you discuss any questions you have with your health care provider.

## 2015-03-27 ENCOUNTER — Ambulatory Visit (INDEPENDENT_AMBULATORY_CARE_PROVIDER_SITE_OTHER): Payer: BC Managed Care – PPO

## 2015-03-27 DIAGNOSIS — Z23 Encounter for immunization: Secondary | ICD-10-CM | POA: Diagnosis not present

## 2015-05-31 ENCOUNTER — Ambulatory Visit (INDEPENDENT_AMBULATORY_CARE_PROVIDER_SITE_OTHER): Payer: BC Managed Care – PPO | Admitting: Family Medicine

## 2015-05-31 VITALS — BP 124/86 | HR 85 | Temp 97.7°F | Resp 16 | Ht 63.0 in | Wt 114.9 lb

## 2015-05-31 DIAGNOSIS — K1370 Unspecified lesions of oral mucosa: Secondary | ICD-10-CM

## 2015-05-31 NOTE — Progress Notes (Signed)
Patient ID: Ariana Maldonado, female    DOB: May 10, 1945  Age: 70 y.o. MRN: [id number]  Chief Complaint  Patient presents with  . Mouth    black spot along the gum line left side--no pain    Subjective:   70 year old lady who comes in here having noted a black spot in the left side of her mouth yesterday. She just had brushed her teeth and noted this area. It was not painful or does not know of any reason they called attention to itself. She just happened to notice it and it looked very black on the inside wall of her cheek adjacent to the back molar. Knows of no trauma to it. She is not a smoker or drinker  Current allergies, medications, problem list, past/family and social histories reviewed.  Objective:  BP 124/86 mmHg  Pulse 85  Temp(Src) 97.7 F (36.5 C) (Oral)  Resp 16  Ht  (1.6 m)  Wt 114 lb 14.4 oz (52.118 kg)  BMI 20.36 kg/m2  SpO2 99%  Erythematous area just below Stensen's duct in the left buccal mucosa.. No palpable nodularity to it. No nodes noted.  Assessment & Plan:   Assessment: 1. Oral mucosal lesion       Plan: I think this was just oral trauma and should clear on up. Probably had a blood blister that looked black last night. Will recheck if it gives further problems.  No orders of the defined types were placed in this encounter.    No orders of the defined types were placed in this encounter.         Patient Instructions  Continue to observe the place. If it persists over the next 2 weeks please return for a recheck.     Return if symptoms worsen or fail to improve.   HOPPER,DAVID, MD 05/31/2015

## 2015-05-31 NOTE — Patient Instructions (Signed)
Continue to observe the place. If it persists over the next 2 weeks please return for a recheck.

## 2016-01-20 ENCOUNTER — Other Ambulatory Visit: Payer: Self-pay | Admitting: Family Medicine

## 2016-02-21 ENCOUNTER — Ambulatory Visit (INDEPENDENT_AMBULATORY_CARE_PROVIDER_SITE_OTHER): Payer: BC Managed Care – PPO | Admitting: Family Medicine

## 2016-02-21 VITALS — BP 122/72 | HR 76 | Temp 98.1°F | Resp 17 | Ht 63.0 in | Wt 113.0 lb

## 2016-02-21 DIAGNOSIS — I10 Essential (primary) hypertension: Secondary | ICD-10-CM

## 2016-02-21 LAB — LIPID PANEL
CHOL/HDL RATIO: 5.1 ratio — AB (ref <=5.0)
Cholesterol: 221 mg/dL — ABNORMAL HIGH (ref 125–200)
HDL: 43 mg/dL — ABNORMAL LOW (ref >=46)
LDL Cholesterol: 160 mg/dL — ABNORMAL HIGH (ref <130)
Triglycerides: 91 mg/dL (ref <150)
VLDL: 18 mg/dL (ref <30)

## 2016-02-21 LAB — COMPLETE METABOLIC PANEL WITH GFR
ALBUMIN: 4.5 g/dL (ref 3.6–5.1)
ALK PHOS: 70 U/L (ref 33–130)
ALT: 8 U/L (ref 6–29)
AST: 17 U/L (ref 10–35)
BILIRUBIN TOTAL: 0.8 mg/dL (ref 0.2–1.2)
BUN: 19 mg/dL (ref 7–25)
CALCIUM: 9.6 mg/dL (ref 8.6–10.4)
CO2: 29 mmol/L (ref 20–31)
Chloride: 101 mmol/L (ref 98–110)
Creat: 1 mg/dL — ABNORMAL HIGH (ref 0.50–0.99)
GFR, EST AFRICAN AMERICAN: 66 mL/min (ref >=60)
GFR, EST NON AFRICAN AMERICAN: 58 mL/min — AB (ref >=60)
GLUCOSE: 79 mg/dL (ref 65–99)
Potassium: 4.2 mmol/L (ref 3.5–5.3)
Sodium: 139 mmol/L (ref 135–146)
TOTAL PROTEIN: 7.2 g/dL (ref 6.1–8.1)

## 2016-02-21 LAB — CBC WITH DIFFERENTIAL/PLATELET
BASOS ABS: 87 {cells}/uL (ref 0–200)
Basophils Relative: 1 %
EOS ABS: 174 {cells}/uL (ref 15–500)
Eosinophils Relative: 2 %
HEMATOCRIT: 43 % (ref 35.0–45.0)
HEMOGLOBIN: 14.3 g/dL (ref 11.7–15.5)
LYMPHS ABS: 2436 {cells}/uL (ref 850–3900)
Lymphocytes Relative: 28 %
MCH: 28.5 pg (ref 27.0–33.0)
MCHC: 33.3 g/dL (ref 32.0–36.0)
MCV: 85.8 fL (ref 80.0–100.0)
MPV: 9.6 fL (ref 7.5–12.5)
Monocytes Absolute: 783 cells/uL (ref 200–950)
Monocytes Relative: 9 %
NEUTROS ABS: 5220 {cells}/uL (ref 1500–7800)
NEUTROS PCT: 60 %
Platelets: 276 10*3/uL (ref 140–400)
RBC: 5.01 MIL/uL (ref 3.80–5.10)
RDW: 13.5 % (ref 11.0–15.0)
WBC: 8.7 10*3/uL (ref 3.8–10.8)

## 2016-02-21 LAB — TSH: TSH: 1.71 m[IU]/L

## 2016-02-21 MED ORDER — LISINOPRIL-HYDROCHLOROTHIAZIDE 20-12.5 MG PO TABS: 1.0000 | ORAL_TABLET | Freq: Every day | ORAL | 3 refills | Status: DC

## 2016-02-21 NOTE — Progress Notes (Signed)
   Patient ID: Ariana Maldonado, female    DOB: 11/04/1945, 70 y.o.   MRN: [id number]  PCP: Tally DueGUEST, CHRIS WARREN, MD  Chief Complaint  Patient presents with  . Medication Refill    lisinopril-hctz    Subjective:   HPI [age] year female, present for evaluation hypertension and medication refill. No swelling, no shortness of breath, or headaches. Occasionally checks blood pressure and never is really high. Denies chest pain, headache, or shortness of breath.Reports overall good health and doesn't take any other medications and reports no side effects from lisinopril-HCTZ.   Review of Systems See HPI  Patient Active Problem List   Diagnosis Date Noted  . Essential hypertension 09/05/2009  . Pneumothorax 09/05/2009  . DIAPHRAGMATIC HERNIA WITH OBSTRUCTION 09/05/2009  . PERS HX NONCOMPLIANCE W/MED TX PRS HAZARDS HLTH 09/05/2009  . PNEUMOTHORAX 09/05/2009    Prior to Admission medications   Medication Sig Start Date End Date Taking? Authorizing Provider  lisinopril-hydrochlorothiazide (PRINZIDE,ZESTORETIC) 20-12.5 MG tablet Take 1 tablet by mouth daily. 01/24/15  Yes Ariana MochaEva N Shaw, MD    Allergies  Allergen Reactions  . Sulfonamide Derivatives     REACTION: hive     Objective:  Physical Exam  Constitutional: She is oriented to person, place, and time. She appears well-developed and well-nourished.  HENT:  Head: Normocephalic and atraumatic.  Right Ear: External ear normal.  Left Ear: External ear normal.  Mouth/Throat: Oropharynx is clear and moist.  Eyes: Conjunctivae and EOM are normal. Pupils are equal, round, and reactive to light.  Neck: Normal range of motion. Neck supple.  Cardiovascular: Normal rate, regular rhythm, normal heart sounds and intact distal pulses.   Pulmonary/Chest: Effort normal.  Musculoskeletal: Normal range of motion.  Neurological: She is alert and oriented to person, place, and time.  Skin: Skin is warm and dry.  Psychiatric: She has a normal mood  and affect. Her behavior is normal. Judgment and thought content normal.    Vitals:   02/21/16 1004  BP: 122/72  Pulse: 76  Resp: 17  Temp: 98.1 F (36.7 C)   Assessment & Plan:  1. Essential hypertension - CBC with Differential/Platelet - COMPLETE METABOLIC PANEL WITH GFR - Lipid panel - TSH  Plan: Continue Lisinopril-Hydrochlorothiazide 20-12.5 1 tablet daily   Follow-up as needed.  Ariana PickKimberly S. Tiburcio PeaHarris, MSN, FNP-C [facility]

## 2016-02-21 NOTE — Patient Instructions (Addendum)
I have refilled your lisinopril-HCTZ for 90 days with 3 refills.  We will contact you with your lab results.    IF you received an x-ray today, you will receive an invoice from [facility]. Please contact [facility] at [phone] with questions or concerns regarding your invoice.   IF you received labwork today, you will receive an invoice from [facility]. Please contact [facility] at [phone] with questions or concerns regarding your invoice.   Our billing staff will not be able to assist you with questions regarding bills from these companies.  You will be contacted with the lab results as soon as they are available. The fastest way to get your results is to activate your My Chart account. Instructions are located on the last page of this paperwork. If you have not heard from us regarding the results in 2 weeks, please contact this office.      Hypertension Hypertension, commonly called high blood pressure, is when the force of blood pumping through your arteries is too strong. Your arteries are the blood vessels that carry blood from your heart throughout your body. A blood pressure reading consists of a higher number over a lower number, such as 110/72. The higher number (systolic) is the pressure inside your arteries when your heart pumps. The lower number (diastolic) is the pressure inside your arteries when your heart relaxes. Ideally you want your blood pressure below 120/80. Hypertension forces your heart to work harder to pump blood. Your arteries may become narrow or stiff. Having untreated or uncontrolled hypertension can cause heart attack, stroke, kidney disease, and other problems. RISK FACTORS Some risk factors for high blood pressure are controllable. Others are not.  Risk factors you cannot control include:   Race. You may be at higher risk if you are African American.  Age. Risk increases with age.  Gender. Men are at  higher risk than women before age 345 years. After age 69, women are at higher risk than men. Risk factors you can control include:  Not getting enough exercise or physical activity.  Being overweight.  Getting too much fat, sugar, calories, or salt in your diet.  Drinking too much alcohol. SIGNS AND SYMPTOMS Hypertension does not usually cause signs or symptoms. Extremely high blood pressure (hypertensive crisis) may cause headache, anxiety, shortness of breath, and nosebleed. DIAGNOSIS To check if you have hypertension, your health care provider will measure your blood pressure while you are seated, with your arm held at the level of your heart. It should be measured at least twice using the same arm. Certain conditions can cause a difference in blood pressure between your right and left arms. A blood pressure reading that is higher than normal on one occasion does not mean that you need treatment. If it is not clear whether you have high blood pressure, you may be asked to return on a different day to have your blood pressure checked again. Or, you may be asked to monitor your blood pressure at home for 1 or more weeks. TREATMENT Treating high blood pressure includes making lifestyle changes and possibly taking medicine. Living a healthy lifestyle can help lower high blood pressure. You may need to change some of your habits. Lifestyle changes may include:  Following the DASH diet. This diet is high in fruits, vegetables, and whole grains. It is low in salt, red meat, and added sugars.  Keep your sodium intake below 2,300 mg per day.  Getting at least 30-45 minutes  of aerobic exercise at least 4 times per week.  Losing weight if necessary.  Not smoking.  Limiting alcoholic beverages.  Learning ways to reduce stress. Your health care provider may prescribe medicine if lifestyle changes are not enough to get your blood pressure under control, and if one of the following is true:  You  are 4318-69 years of age and your systolic blood pressure is above 140.  You are 69 years of age or older, and your systolic blood pressure is above 150.  Your diastolic blood pressure is above 90.  You have diabetes, and your systolic blood pressure is over 140 or your diastolic blood pressure is over 90.  You have kidney disease and your blood pressure is above 140/90.  You have heart disease and your blood pressure is above 140/90. Your personal target blood pressure may vary depending on your medical conditions, your age, and other factors. HOME CARE INSTRUCTIONS  Have your blood pressure rechecked as directed by your health care provider.   Take medicines only as directed by your health care provider. Follow the directions carefully. Blood pressure medicines must be taken as prescribed. The medicine does not work as well when you skip doses. Skipping doses also puts you at risk for problems.  Do not smoke.   Monitor your blood pressure at home as directed by your health care provider. SEEK MEDICAL CARE IF:   You think you are having a reaction to medicines taken.  You have recurrent headaches or feel dizzy.  You have swelling in your ankles.  You have trouble with your vision. SEEK IMMEDIATE MEDICAL CARE IF:  You develop a severe headache or confusion.  You have unusual weakness, numbness, or feel faint.  You have severe chest or abdominal pain.  You vomit repeatedly.  You have trouble breathing. MAKE SURE YOU:   Understand these instructions.  Will watch your condition.  Will get help right away if you are not doing well or get worse.   This information is not intended to replace advice given to you by your health care provider. Make sure you discuss any questions you have with your health care provider.   Document Released: 04/12/2005 Document Revised: 08/27/2014 Document Reviewed: 02/02/2013 Elsevier Interactive Patient Education [employer]2016 Elsevier Inc.

## 2016-03-12 ENCOUNTER — Telehealth: Payer: Self-pay

## 2016-03-12 NOTE — Telephone Encounter (Signed)
Patient is calling to follow up on her Lovostatin 20mg  prescription.  She said Jerrilyn CairoKim Harris had sent her her lab results and in the notes it said she had ordered her to start taking Lovostatin and come back in 6 months.  She hasn't heard from us or the pharmacy about the prescription. Please advise when possible.  Patient states she would like the prescription to be sent to the [facility] off of [address].  [phone]

## 2016-03-16 MED ORDER — LOVASTATIN 20 MG PO TABS: 20.0000 mg | ORAL_TABLET | Freq: Every day | ORAL | 1 refills | Status: DC

## 2016-03-16 NOTE — Telephone Encounter (Signed)
Still no ans/no VM.

## 2016-03-16 NOTE — Telephone Encounter (Signed)
Sent in Rx for pt per Kim's notes on lab results: I have ordered Lovastatin 20 mg for you to take daily with dinner. Tried to notified pt and reminded of 6 mos f/up, but no ans/no VM.

## 2016-03-16 NOTE — Telephone Encounter (Signed)
Patient called back to check on prescription.  I notified her that it shows that the pharmacy had received the prescription at 10:30 this morning.

## 2016-03-16 NOTE — Telephone Encounter (Signed)
Pt called back to state that  The lovastatin 20 mg has not been received from the pharmacy and she needs this   Best number [phone]

## 2017-03-15 ENCOUNTER — Ambulatory Visit: Payer: BC Managed Care – PPO | Admitting: Emergency Medicine

## 2017-03-15 ENCOUNTER — Other Ambulatory Visit: Payer: Self-pay

## 2017-03-15 ENCOUNTER — Encounter: Payer: Self-pay | Admitting: Emergency Medicine

## 2017-03-15 VITALS — BP 138/66 | HR 90 | Temp 98.2°F | Resp 16 | Ht 62.0 in | Wt 112.6 lb

## 2017-03-15 DIAGNOSIS — I10 Essential (primary) hypertension: Secondary | ICD-10-CM

## 2017-03-15 DIAGNOSIS — E785 Hyperlipidemia, unspecified: Secondary | ICD-10-CM

## 2017-03-15 DIAGNOSIS — J029 Acute pharyngitis, unspecified: Secondary | ICD-10-CM

## 2017-03-15 MED ORDER — LOVASTATIN 20 MG PO TABS
20.0000 mg | ORAL_TABLET | Freq: Every day | ORAL | 3 refills | Status: AC
Start: 2017-03-15 — End: ?

## 2017-03-15 MED ORDER — LISINOPRIL-HYDROCHLOROTHIAZIDE 20-12.5 MG PO TABS: 1.0000 | ORAL_TABLET | Freq: Every day | ORAL | 3 refills | Status: AC

## 2017-03-15 MED ORDER — AMOXICILLIN-POT CLAVULANATE 875-125 MG PO TABS: 1.0000 | ORAL_TABLET | Freq: Two times a day (BID) | ORAL | 0 refills | Status: AC

## 2017-03-15 NOTE — Patient Instructions (Addendum)
     IF you received an x-ray today, you will receive an invoice from [facility]. Please contact [facility] at [phone] with questions or concerns regarding your invoice.   IF you received labwork today, you will receive an invoice from [facility]. Please contact [facility] at [phone] with questions or concerns regarding your invoice.   Our billing staff will not be able to assist you with questions regarding bills from these companies.  You will be contacted with the lab results as soon as they are available. The fastest way to get your results is to activate your My Chart account. Instructions are located on the last page of this paperwork. If you have not heard from us regarding the results in 2 weeks, please contact this office.     Pharyngitis Pharyngitis is a sore throat (pharynx). There is redness, pain, and swelling of your throat. Follow these instructions at home:  Drink enough fluids to keep your pee (urine) clear or pale yellow.  Only take medicine as told by your doctor.  You may get sick again if you do not take medicine as told. Finish your medicines, even if you start to feel better.  Do not take aspirin.  Rest.  Rinse your mouth (gargle) with salt water ( tsp of salt per 1 qt of water) every 1-2 hours. This will help the pain.  If you are not at risk for choking, you can suck on hard candy or sore throat lozenges. Contact a doctor if:  You have large, tender lumps on your neck.  You have a rash.  You cough up green, yellow-brown, or bloody spit. Get help right away if:  You have a stiff neck.  You drool or cannot swallow liquids.  You throw up (vomit) or are not able to keep medicine or liquids down.  You have very bad pain that does not go away with medicine.  You have problems breathing (not from a stuffy nose). This information is not intended to replace advice given to you by your health care provider. Make sure you  discuss any questions you have with your health care provider. Document Released: 09/29/2007 Document Revised: 09/18/2015 Document Reviewed: 12/18/2012 Elsevier Interactive Patient Education  2017 Elsevier Inc.  

## 2017-03-15 NOTE — Progress Notes (Signed)
Ariana Maldonado 71 y.o.   Chief Complaint  Patient presents with  . Sore Throat    x 2 days  . Medication Refill    lisinopril-hctz and lovastatin    HISTORY OF PRESENT ILLNESS: This is a 71 y.o. female complaining of sore throat x 2 days. Also requesting blood pressure and cholesterol medication refills.  Sore Throat   This is a new problem. The current episode started in the past 7 days. The problem has been gradually worsening. There has been no fever. The pain is at a severity of 3/10. The pain is mild. Associated symptoms include coughing and swollen glands. Pertinent negatives include no abdominal pain, congestion, diarrhea, ear pain, headaches, neck pain, shortness of breath, trouble swallowing or vomiting. She has had no exposure to strep or mono.     Prior to Admission medications   Medication Sig Start Date End Date Taking? Authorizing Provider  lisinopril-hydrochlorothiazide (PRINZIDE,ZESTORETIC) 20-12.5 MG tablet Take 1 tablet by mouth daily. 02/21/16  Yes Bing Neighbors, FNP  lovastatin (MEVACOR) 20 MG tablet Take 1 tablet (20 mg total) by mouth daily. Take with dinner. 03/16/16  Yes Bing Neighbors, FNP    Allergies  Allergen Reactions  . Sulfonamide Derivatives     REACTION: hive    Patient Active Problem List   Diagnosis Date Noted  . Essential hypertension 09/05/2009  . Pneumothorax 09/05/2009  . DIAPHRAGMATIC HERNIA WITH OBSTRUCTION 09/05/2009  . PERS HX NONCOMPLIANCE W/MED TX PRS HAZARDS HLTH 09/05/2009  . PNEUMOTHORAX 09/05/2009    Past Medical History:  Diagnosis Date  . Hypertension     Past Surgical History:  Procedure Laterality Date  . ABDOMINAL HYSTERECTOMY    . HERNIA REPAIR    . PLEURAL SCARIFICATION      Social History   Socioeconomic History  . Marital status: Widowed    Spouse name: Not on file  . Number of children: Not on file  . Years of education: Not on file  . Highest education level: Not on file  Social Needs    . Financial resource strain: Not on file  . Food insecurity - worry: Not on file  . Food insecurity - inability: Not on file  . Transportation needs - medical: Not on file  . Transportation needs - non-medical: Not on file  Occupational History  . Not on file  Tobacco Use  . Smoking status: Never Smoker  . Smokeless tobacco: Never Used  Substance and Sexual Activity  . Alcohol use: No  . Drug use: No  . Sexual activity: No  Other Topics Concern  . Not on file  Social History Narrative  . Not on file    No family history on file.   Review of Systems  Constitutional: Negative.  Negative for chills, fever and malaise/fatigue.  HENT: Positive for sore throat. Negative for congestion, ear pain, nosebleeds and trouble swallowing.   Eyes: Negative for discharge and redness.  Respiratory: Positive for cough. Negative for shortness of breath and wheezing.   Cardiovascular: Negative for chest pain, palpitations, claudication and leg swelling.  Gastrointestinal: Negative for abdominal pain, blood in stool, diarrhea, melena, nausea and vomiting.  Genitourinary: Negative.  Negative for dysuria and hematuria.  Musculoskeletal: Negative for neck pain.  Skin: Negative.  Negative for rash.  Neurological: Negative.  Negative for dizziness, weakness and headaches.  Endo/Heme/Allergies: Negative.   All other systems reviewed and are negative.  Vitals:   03/15/17 1035 03/15/17 1037  BP: (!) 140/92 138/66  Pulse: 90   Resp: 16   Temp: 98.2 F (36.8 C)   SpO2: 97%    BP Readings from Last 3 Encounters:  03/15/17 138/66  02/21/16 122/72  05/31/15 124/86      Physical Exam  Constitutional: She is oriented to person, place, and time. She appears well-developed and well-nourished.  HENT:  Head: Normocephalic and atraumatic.  Nose: Nose normal.  Mouth/Throat: No uvula swelling. Posterior oropharyngeal erythema present. No oropharyngeal exudate or tonsillar abscesses.  Eyes:  Conjunctivae and EOM are normal. Pupils are equal, round, and reactive to light.  Neck: Normal range of motion. Neck supple. No thyromegaly present.  Cardiovascular: Normal rate, regular rhythm, normal heart sounds and intact distal pulses.  Pulmonary/Chest: Effort normal and breath sounds normal.  Abdominal: Soft. She exhibits no distension. There is no tenderness.  Musculoskeletal: Normal range of motion.  Lymphadenopathy:    She has cervical adenopathy.  Neurological: She is alert and oriented to person, place, and time. No sensory deficit. She exhibits normal muscle tone.  Skin: Skin is warm and dry. Capillary refill takes less than 2 seconds.  Psychiatric: She has a normal mood and affect. Her behavior is normal.  Vitals reviewed.    ASSESSMENT & PLAN: Ariana RayaConstance was seen today for sore throat and medication refill.  Diagnoses and all orders for this visit:  Sore throat  Acute pharyngitis, unspecified etiology -     amoxicillin-clavulanate (AUGMENTIN) 875-125 MG tablet; Take 1 tablet by mouth 2 (two) times daily for 7 days.  Essential hypertension -     lisinopril-hydrochlorothiazide (PRINZIDE,ZESTORETIC) 20-12.5 MG tablet; Take 1 tablet by mouth daily.  Dyslipidemia -     lovastatin (MEVACOR) 20 MG tablet; Take 1 tablet (20 mg total) by mouth daily. Take with dinner.    Patient Instructions       IF you received an x-ray today, you will receive an invoice from [facility]. Please contact [facility] at [phone] with questions or concerns regarding your invoice.   IF you received labwork today, you will receive an invoice from [facility]. Please contact [facility] at [phone] with questions or concerns regarding your invoice.   Our billing staff will not be able to assist you with questions regarding bills from these companies.  You will be contacted with the lab results as soon as they are available. The fastest way to get your results is to  activate your My Chart account. Instructions are located on the last page of this paperwork. If you have not heard from us regarding the results in 2 weeks, please contact this office.     Pharyngitis Pharyngitis is a sore throat (pharynx). There is redness, pain, and swelling of your throat. Follow these instructions at home:  Drink enough fluids to keep your pee (urine) clear or pale yellow.  Only take medicine as told by your doctor. ? You may get sick again if you do not take medicine as told. Finish your medicines, even if you start to feel better. ? Do not take aspirin.  Rest.  Rinse your mouth (gargle) with salt water ( tsp of salt per 1 qt of water) every 1-2 hours. This will help the pain.  If you are not at risk for choking, you can suck on hard candy or sore throat lozenges. Contact a doctor if:  You have large, tender lumps on your neck.  You have a rash.  You cough up green, yellow-brown, or bloody spit. Get help right away if:  You  have a stiff neck.  You drool or cannot swallow liquids.  You throw up (vomit) or are not able to keep medicine or liquids down.  You have very bad pain that does not go away with medicine.  You have problems breathing (not from a stuffy nose). This information is not intended to replace advice given to you by your health care provider. Make sure you discuss any questions you have with your health care provider. Document Released: 09/29/2007 Document Revised: 09/18/2015 Document Reviewed: 12/18/2012 Elsevier Interactive Patient Education  2017 Elsevier Inc.      Edwina BarthMiguel Elverda Wendel, MD [facility]

## 2018-04-01 ENCOUNTER — Ambulatory Visit: Payer: BC Managed Care – PPO | Admitting: Osteopathic Medicine

## 2021-01-06 DIAGNOSIS — Z1231 Encounter for screening mammogram for malignant neoplasm of breast: Secondary | ICD-10-CM | POA: Diagnosis not present

## 2021-01-06 DIAGNOSIS — I129 Hypertensive chronic kidney disease with stage 1 through stage 4 chronic kidney disease, or unspecified chronic kidney disease: Secondary | ICD-10-CM | POA: Diagnosis not present

## 2021-01-06 DIAGNOSIS — Z780 Asymptomatic menopausal state: Secondary | ICD-10-CM | POA: Diagnosis not present

## 2021-01-06 DIAGNOSIS — N1831 Chronic kidney disease, stage 3a: Secondary | ICD-10-CM | POA: Diagnosis not present

## 2021-01-06 DIAGNOSIS — Z23 Encounter for immunization: Secondary | ICD-10-CM | POA: Diagnosis not present

## 2021-01-06 DIAGNOSIS — E7800 Pure hypercholesterolemia, unspecified: Secondary | ICD-10-CM | POA: Diagnosis not present

## 2021-01-22 DIAGNOSIS — Z1211 Encounter for screening for malignant neoplasm of colon: Secondary | ICD-10-CM | POA: Diagnosis not present

## 2021-01-29 DIAGNOSIS — M810 Age-related osteoporosis without current pathological fracture: Secondary | ICD-10-CM | POA: Diagnosis not present

## 2021-01-29 DIAGNOSIS — Z780 Asymptomatic menopausal state: Secondary | ICD-10-CM | POA: Diagnosis not present

## 2021-01-29 DIAGNOSIS — Z1231 Encounter for screening mammogram for malignant neoplasm of breast: Secondary | ICD-10-CM | POA: Diagnosis not present

## 2021-01-30 DIAGNOSIS — N1831 Chronic kidney disease, stage 3a: Secondary | ICD-10-CM | POA: Diagnosis not present

## 2021-02-10 ENCOUNTER — Other Ambulatory Visit: Payer: Self-pay | Admitting: Family Medicine

## 2021-02-18 ENCOUNTER — Other Ambulatory Visit: Payer: Self-pay | Admitting: Family Medicine

## 2021-02-18 DIAGNOSIS — N2889 Other specified disorders of kidney and ureter: Secondary | ICD-10-CM

## 2021-03-08 ENCOUNTER — Other Ambulatory Visit: Payer: Self-pay

## 2021-03-11 ENCOUNTER — Other Ambulatory Visit: Payer: Self-pay

## 2021-03-25 DIAGNOSIS — M810 Age-related osteoporosis without current pathological fracture: Secondary | ICD-10-CM | POA: Diagnosis not present

## 2021-03-30 ENCOUNTER — Ambulatory Visit
Admission: RE | Admit: 2021-03-30 | Discharge: 2021-03-30 | Disposition: A | Discharge status: Home / Self Care | Payer: Medicare PPO | Source: Ambulatory Visit | Attending: Family Medicine | Admitting: Family Medicine

## 2021-03-30 ENCOUNTER — Other Ambulatory Visit: Payer: Self-pay

## 2021-03-30 DIAGNOSIS — N281 Cyst of kidney, acquired: Secondary | ICD-10-CM | POA: Diagnosis not present

## 2021-03-30 DIAGNOSIS — N2889 Other specified disorders of kidney and ureter: Secondary | ICD-10-CM

## 2021-03-30 IMAGING — MR MR ABDOMEN WO/W CM
9 of 17 series · 23 of 48 positions shown · IV contrast (multihance)
Comparison: Ultrasound [DATE]

CLINICAL DATA: Renal mass. Indeterminate renal lesion in the LEFT
kidney on ultrasound. MRI recommended for further evaluation.

EXAM:
MRI ABDOMEN WITHOUT AND WITH CONTRAST
TECHNIQUE: Multiplanar multisequence MR imaging of the abdomen was performed
both before and after the administration of intravenous contrast.
CONTRAST:  9mL MULTIHANCE GADOBENATE DIMEGLUMINE 529 MG/ML IV SOLN

[Series 3: cor haste · coronal · 5.5mm · 0.68mm/px · 2 of 24 slices shown]
[im 1/24]
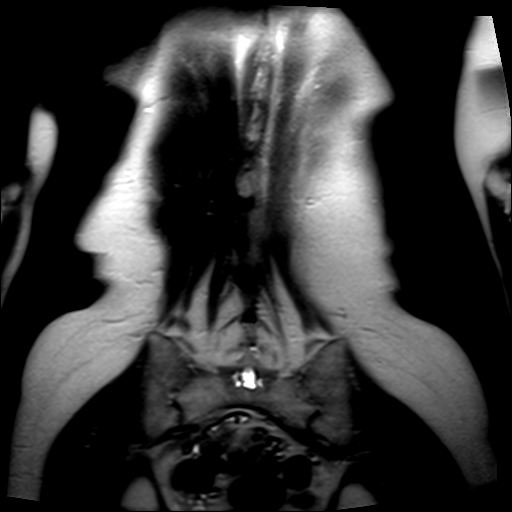
[im 24/24]
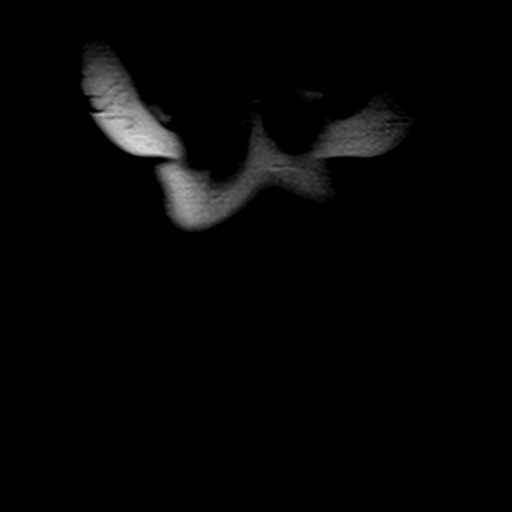

[Series 4: axial haste · axial · 6.5mm · 0.59mm/px · z∈[-55,+114]mm · 2 of 25 slices shown]
[im 1/25]
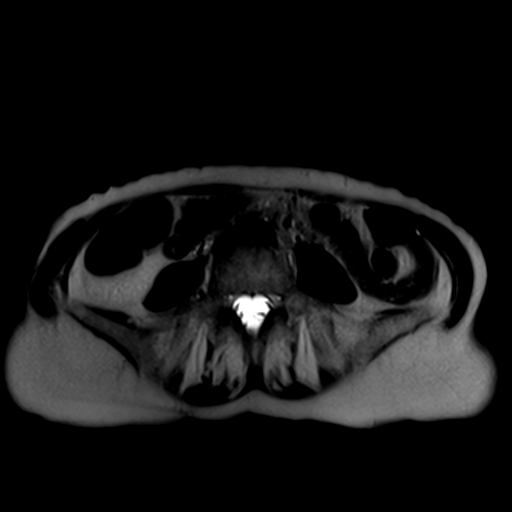
[im 25/25]
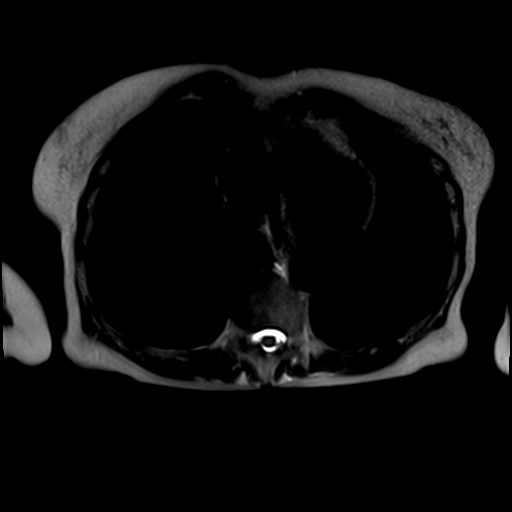

[Series 5: T1 · axial · 6.5mm · 0.59mm/px · z∈[-54,+115]mm · 4 of 50 slices shown]
[im 1/50]
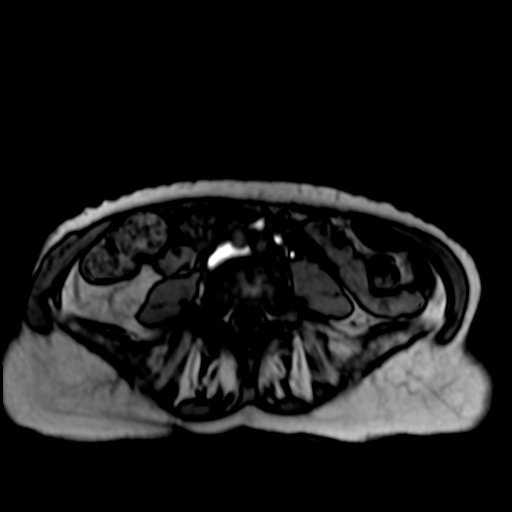
[im 17/50]
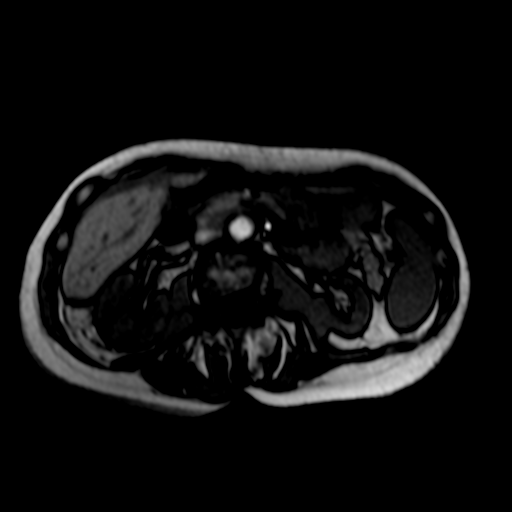
[im 33/50]
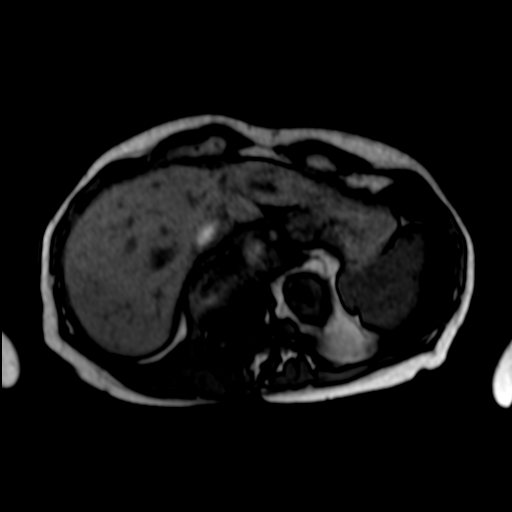
[im 50/50]
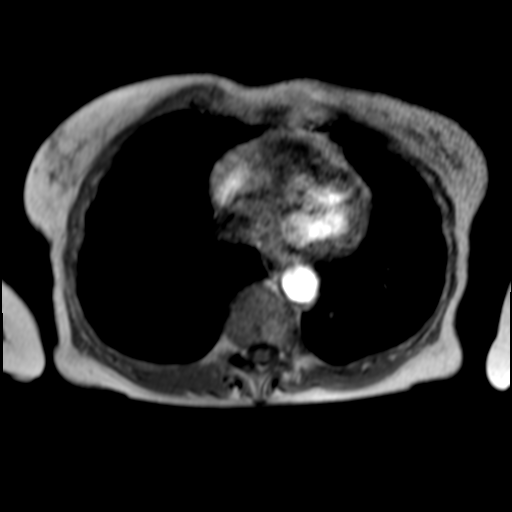

[Series 6: bSSFP · axial · 4.5mm · 0.59mm/px · z∈[-63,+124]mm · 3 of 43 slices shown]
[im 1/43]
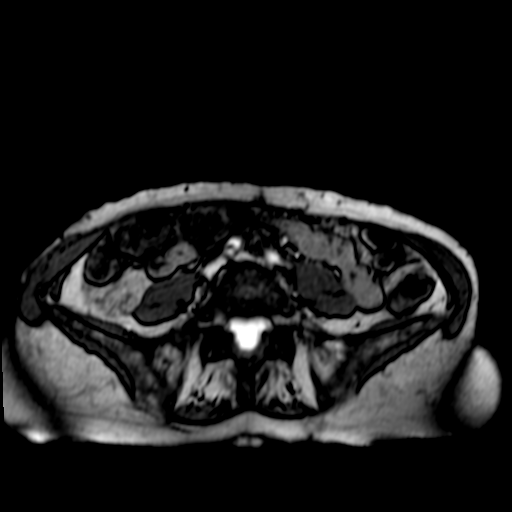
[im 22/43]
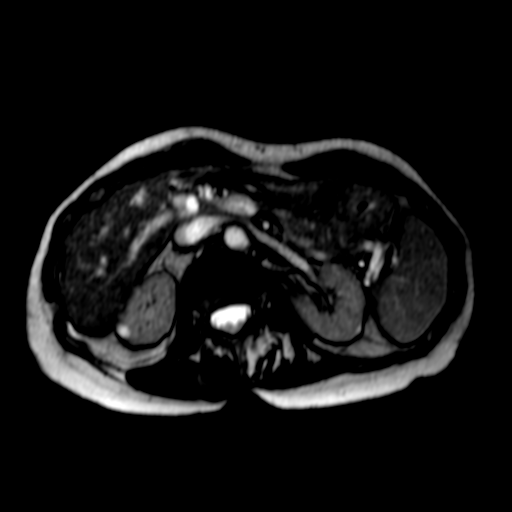
[im 43/43]
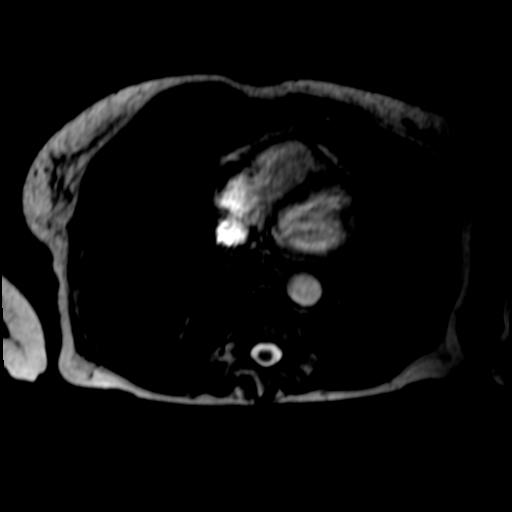

[Series 7: T2 fat-sat · axial · 6.5mm · 0.94mm/px · z∈[-55,+114]mm · 2 of 25 slices shown]
[im 1/25]
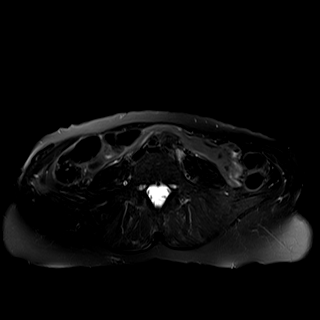
[im 25/25]
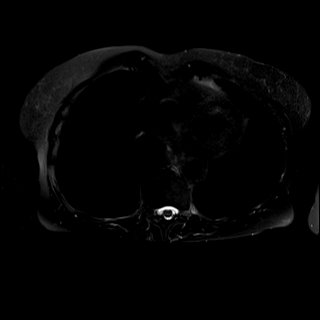

[Series 8: ep2d_[id number]_p2_trig · axial · 6.5mm · 1.56mm/px · z∈[-55,+114]mm · 4 of 75 slices shown]
[im 1/75]
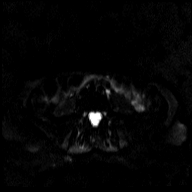
[im 25/75]
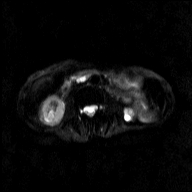
[im 50/75]
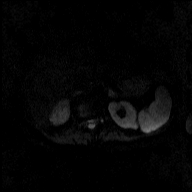
[im 75/75]
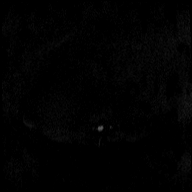

[Series 9: ep2d_[id number]_p2_trig_adc · axial · 6.5mm · 1.56mm/px · 1 of 25 slices shown]
[im 1/25]
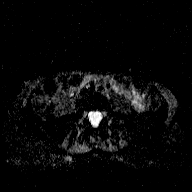

[Series 10: T1 dynamic · axial · non-contrast · 3.0mm · 0.59mm/px · z∈[-64,+122]mm · 3 of 64 slices shown]
[im 1/64]
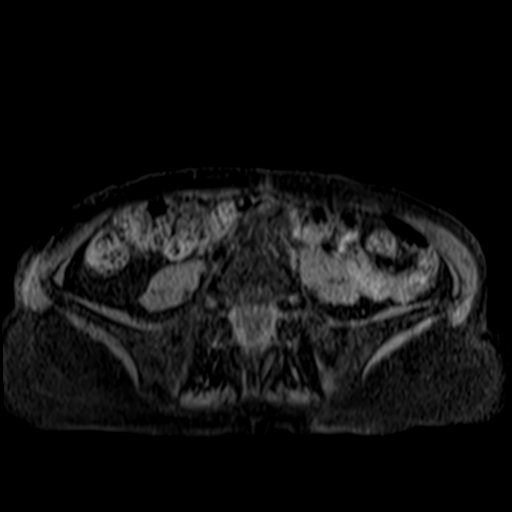
[im 32/64]
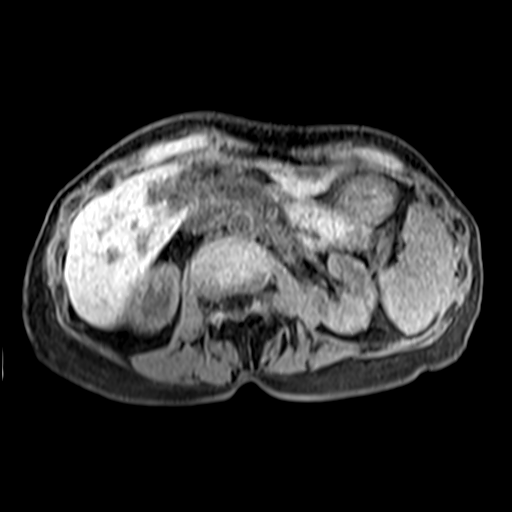
[im 64/64]
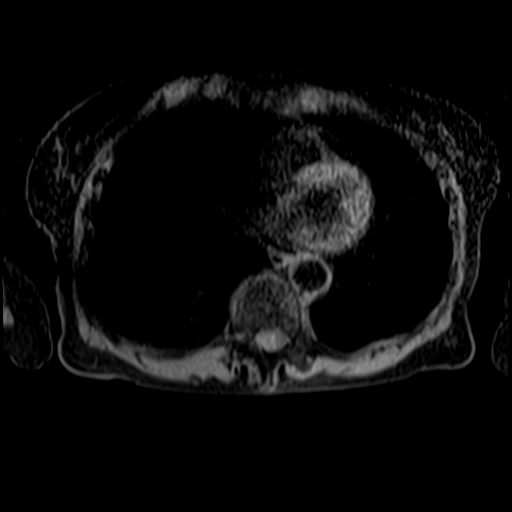

[Series 11: T1 dynamic post-contrast · axial · 3.0mm · 0.59mm/px · z∈[-64,+28]mm · 2 of 64 slices shown]
[im 1/64]
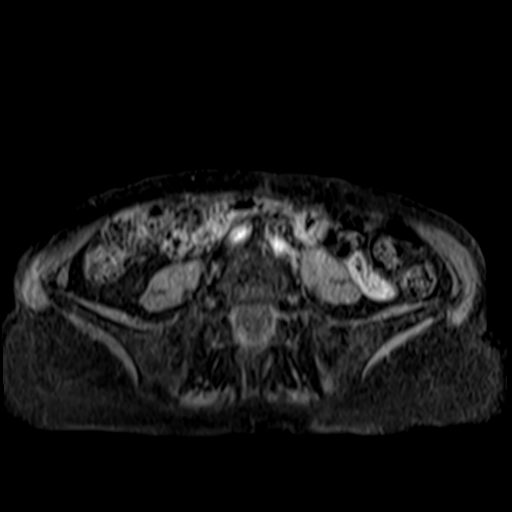
[im 32/64]
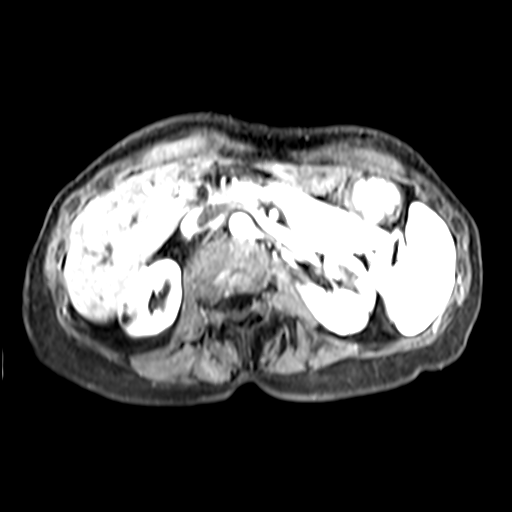

[23 of 48 positions shown; findings below may reference images not displayed]

FINDINGS: Lower chest:  Lung bases are clear.

Hepatobiliary: Mild intrahepatic duct dilatation. No significant
extrahepatic duct dilatation.

Pancreas: Multi lobular (predominantly unilocular) cystic lesion in
the uncinate of the pancreas measures 20 mm x 11 mm (image 18/series
14). Cystic complex measures 27 mm in craniocaudad dimension (image
[DATE]). No clear communication with the pancreatic duct which is
normal caliber. There is no internal enhancement of this collection
on the postcontrast imaging.

Spleen: Normal spleen.

Adrenals/urinary tract: Adrenal glands normal.

There are bilateral cystic lesions within LEFT and RIGHT kidney
which have high signal intensity T2 weighted imaging (series 4) and
no post-contrast enhancement on T1 weighted imaging (series 18 and
series 15 for example). A cyst the upper pole of the LEFT kidney
measuring 20 mm corresponds indeterminate lesion on comparison
ultrasound and also has no suspicious imaging characteristics.

Stomach/Bowel: Stomach and limited of the small bowel is
unremarkable

Vascular/Lymphatic: Abdominal aortic normal caliber. No
retroperitoneal periportal lymphadenopathy.

Musculoskeletal: No aggressive osseous lesion
IMPRESSION: 1. Bilateral Bosniak 1 renal cysts. No worrisome renal lesion
present.
2. Complex cystic mass in the uncinate of the pancreas without
discernible communication with the pancreatic duct. No specific
worrisome characteristics. Recommend follow-up pancreatic protocol
contrast CT or contrast MRI (MRI preferred) in 6 months per
consensus criteria. This recommendation follows ACR consensus
guidelines: Management of Incidental Pancreatic Cysts: A White Paper
of the ACR Incidental Findings Committee. [HOSPITAL]
[YS];[DATE].
3. Mild intrahepatic bile duct dilatation in LEFT hepatic lobe is
favored benign.

These results will be called to the ordering clinician or
representative by the Radiologist Assistant, and communication
documented in the PACS or [REDACTED].

## 2021-03-30 MED ORDER — GADOBENATE DIMEGLUMINE 529 MG/ML IV SOLN
9.0000 mL | Freq: Once | INTRAVENOUS | Status: AC | PRN
  Administered 2021-03-30: 9 mL via INTRAVENOUS

## 2021-08-19 ENCOUNTER — Other Ambulatory Visit: Payer: Self-pay | Admitting: Family Medicine

## 2021-08-19 DIAGNOSIS — K862 Cyst of pancreas: Secondary | ICD-10-CM

## 2021-09-11 ENCOUNTER — Ambulatory Visit
Admission: RE | Admit: 2021-09-11 | Discharge: 2021-09-11 | Disposition: A | Discharge status: Home / Self Care | Payer: Medicare PPO | Source: Ambulatory Visit | Attending: Family Medicine | Admitting: Family Medicine

## 2021-09-11 DIAGNOSIS — N281 Cyst of kidney, acquired: Secondary | ICD-10-CM | POA: Diagnosis not present

## 2021-09-11 DIAGNOSIS — K862 Cyst of pancreas: Secondary | ICD-10-CM

## 2021-09-11 DIAGNOSIS — I700 Atherosclerosis of aorta: Secondary | ICD-10-CM | POA: Diagnosis not present

## 2021-09-11 IMAGING — MR MR ABDOMEN WO/W CM
13 of 21 series · 26 of 48 positions shown · IV contrast (multihance)
Comparison: [DATE]

CLINICAL DATA: Follow-up pancreatic cystic lesion

EXAM:
MRI ABDOMEN WITHOUT AND WITH CONTRAST
TECHNIQUE: Multiplanar multisequence MR imaging of the abdomen was performed
both before and after the administration of intravenous contrast.
CONTRAST:  9mL MULTIHANCE GADOBENATE DIMEGLUMINE 529 MG/ML IV SOLN

[Series 4: cor haste · coronal · 5.0mm · 0.70mm/px · 1 of 29 slices shown]
[im 1/29]
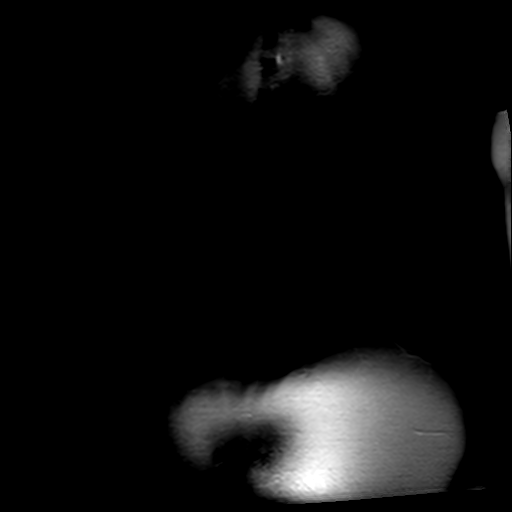

[Series 5: axial haste · axial · 6.0mm · 0.74mm/px · 1 of 27 slices shown]
[im 1/27]
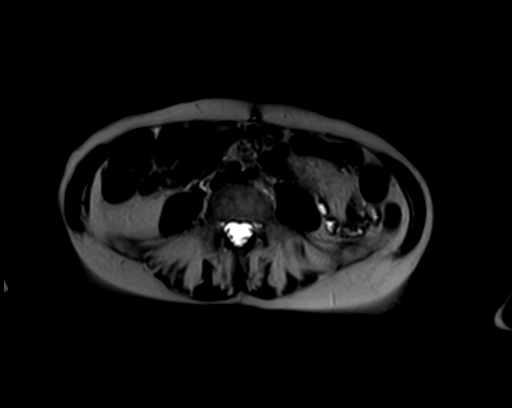

[Series 6: T2 fat-sat · axial · 6.0mm · 1.09mm/px · 1 of 32 slices shown]
[im 1/32]
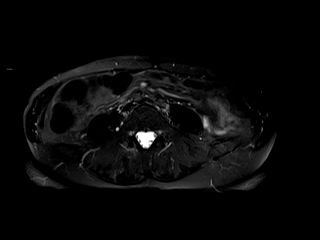

[Series 7: [id number]_trig · axial · 6.0mm · 2.03mm/px · z∈[-59,+157]mm · 3 of 93 slices shown]
[im 1/93]
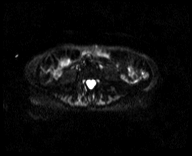
[im 47/93]
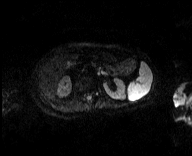
[im 93/93]
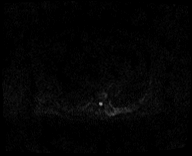

[Series 8: [id number]_trig_adc · axial · 6.0mm · 2.03mm/px · 1 of 31 slices shown]
[im 1/31]
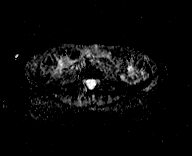

[Series 13: bSSFP · axial · 4.0mm · 0.72mm/px · z∈[-53,+119]mm · 2 of 44 slices shown (1 of 2)]
[im 1/44]
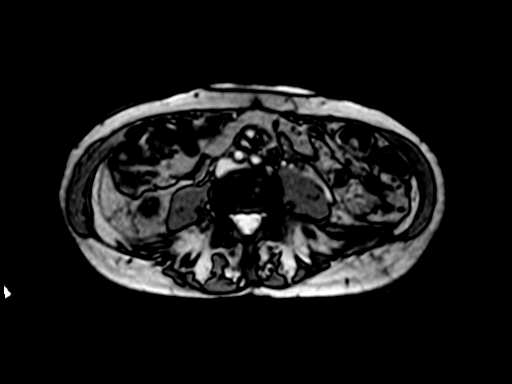
[im 44/44]
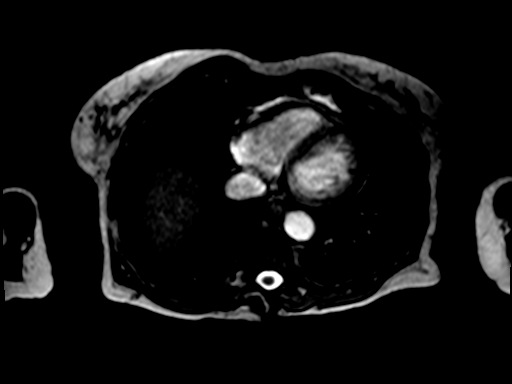

[Series 14: bSSFP · coronal · 5.0mm · 0.70mm/px · 1 of 28 slices shown (2 of 2)]
[im 1/28]
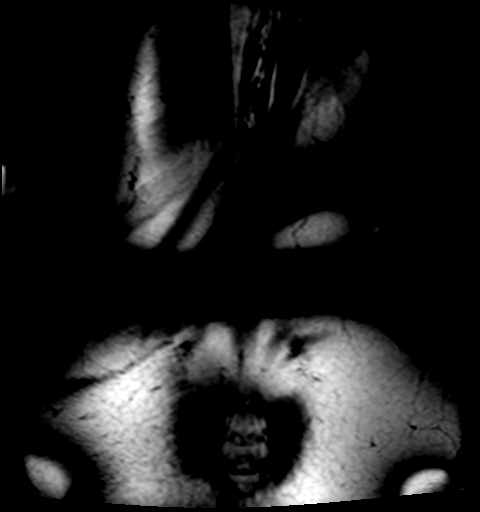

[Series 15: T2 · coronal · 3.0mm · 0.70mm/px · 2 of 56 slices shown]
[im 1/56]
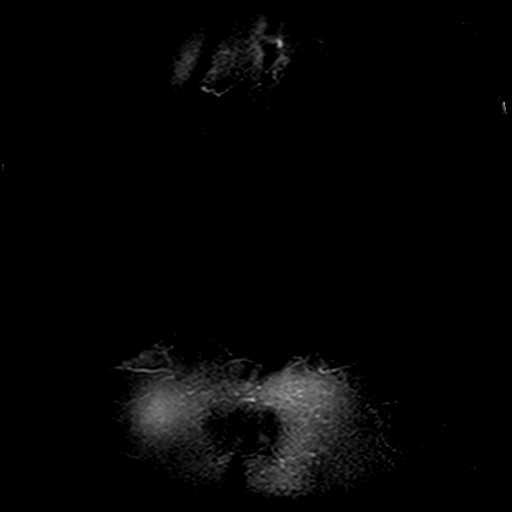
[im 56/56]
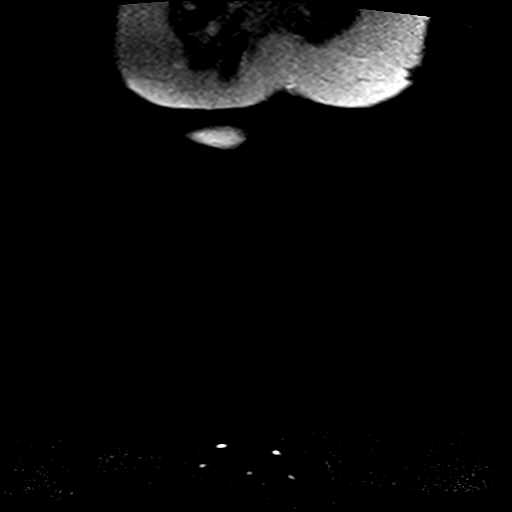

[Series 16: T1 · axial · 6.0mm · 0.74mm/px · z∈[-85,+146]mm · 3 of 72 slices shown]
[im 1/72]
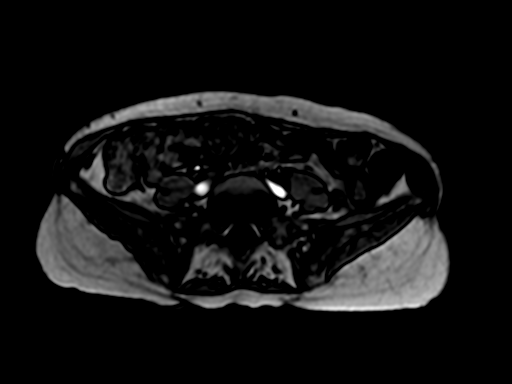
[im 36/72]
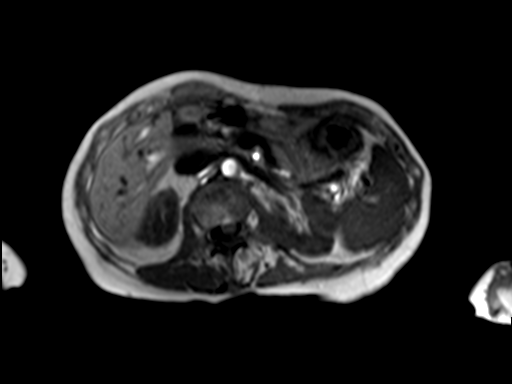
[im 72/72]
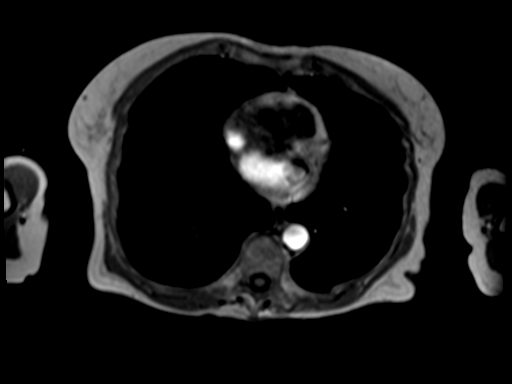

[Series 17: T1 dynamic · axial · non-contrast · 2.5mm · 0.74mm/px · z∈[-71,+127]mm · 3 of 80 slices shown]
[im 1/80]
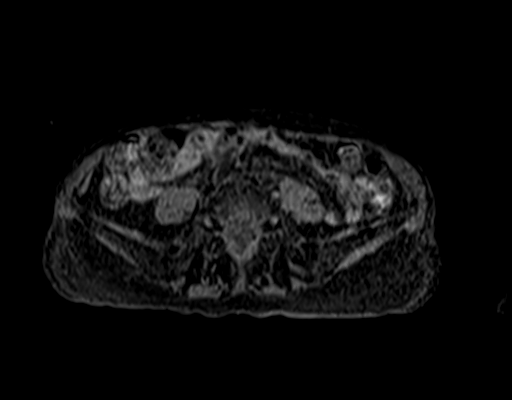
[im 40/80]
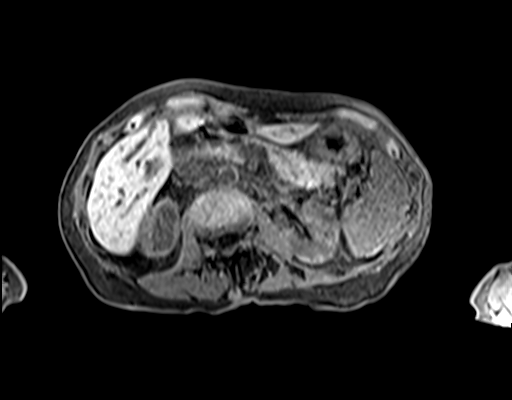
[im 80/80]
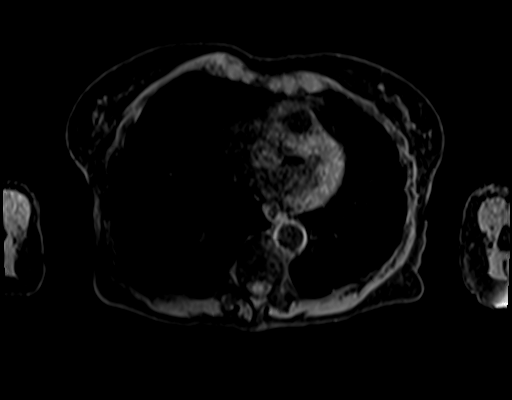

[Series 18: T1 dynamic post-contrast · axial · 2.5mm · 0.74mm/px · z∈[-71,+127]mm · 3 of 80 slices shown (1 of 3)]
[im 1/80]
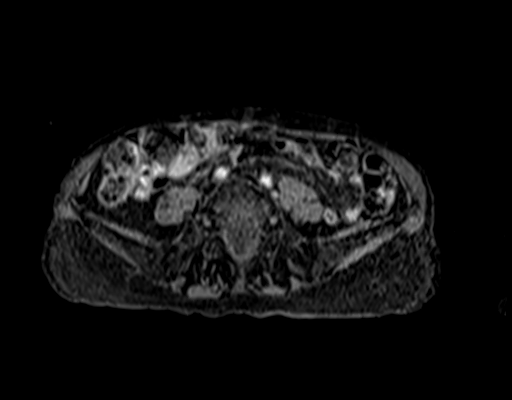
[im 40/80]
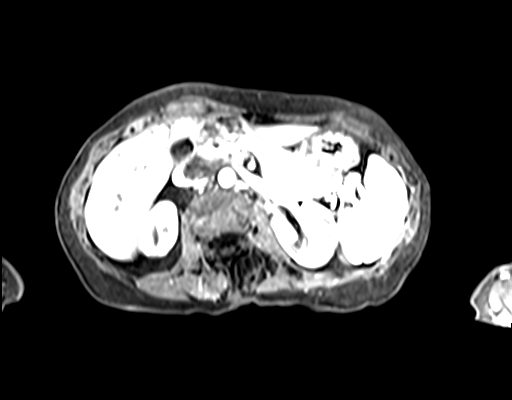
[im 80/80]
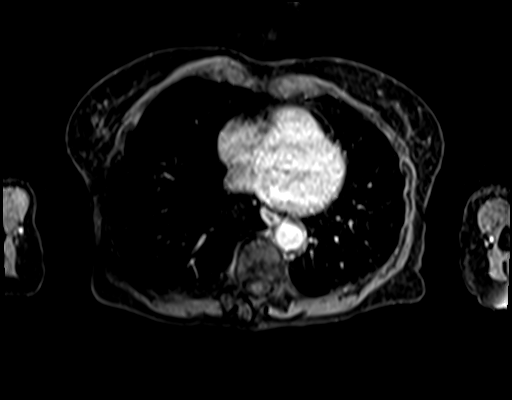

[Series 19: T1 dynamic post-contrast · axial · 2.5mm · 0.74mm/px · z∈[-71,+127]mm · 3 of 80 slices shown (2 of 3)]
[im 1/80]
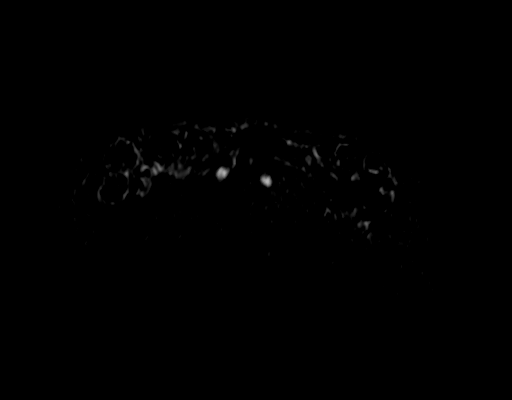
[im 40/80]
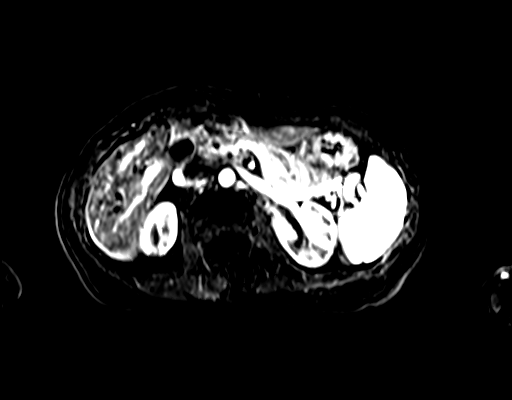
[im 80/80]
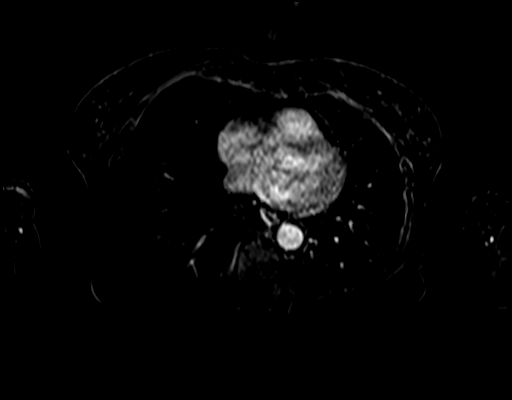

[Series 20: T1 dynamic post-contrast · axial · 2.5mm · 0.74mm/px · z∈[-71,+27]mm · 2 of 80 slices shown (3 of 3)]
[im 1/80]
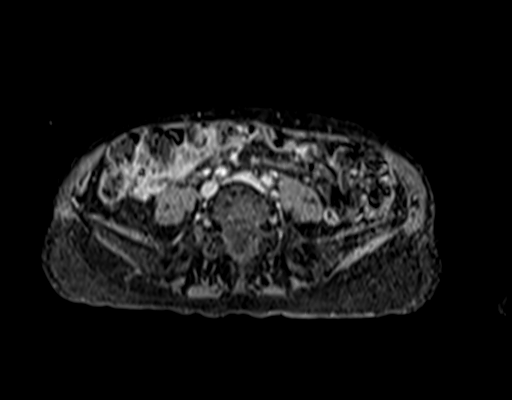
[im 40/80]
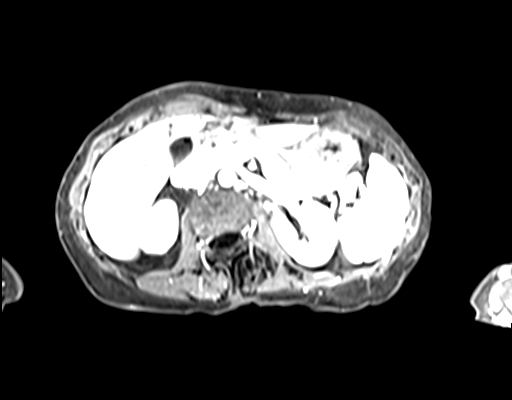

[26 of 48 positions shown; findings below may reference images not displayed]

FINDINGS: Lower chest: No acute findings.

Hepatobiliary: No mass or other parenchymal abnormality identified.
No gallstones. No biliary ductal dilatation.

Pancreas: Unchanged multiloculated, septated cystic lesion of the
inferior pancreatic head and uncinate, measuring 2.7 x 2.2 x 1.0 cm
(series 4, image 22, series 5, image 19). No solid mass,
inflammatory changes, or other parenchymal abnormality identified.No
associated pancreatic ductal dilatation.

Spleen:  Within normal limits in size and appearance.

Adrenals/Urinary Tract: Normal adrenal glands. Multiple simple,
benign bilateral renal cysts without associated contrast
enhancement, for which no further follow-up or characterization is
required. No renal masses or suspicious contrast enhancement
identified. No evidence of hydronephrosis.

Stomach/Bowel: Visualized portions within the abdomen are
unremarkable.

Vascular/Lymphatic: No pathologically enlarged lymph nodes
identified. No abdominal aortic aneurysm demonstrated. Aortic
atherosclerosis.

Other:  None.

Musculoskeletal: No suspicious osseous lesions identified.
IMPRESSION: Unchanged multiloculated, septated cystic lesion of the inferior
pancreatic head and uncinate, measuring 2.7 cm. No solid component,
contrast enhancement, or associated pancreatic ductal dilatation.
This may reflect an IPMN or pseudocyst. Given size, recommend
continued six-month follow-up for a total of 2 years to ensure
initial stability, with further consideration of EUS/FNA for
definitive diagnosis. This recommendation follows ACR white paper
guidelines on management of incidental pancreatic cysts.

Aortic Atherosclerosis ([L5]-[L5]).

## 2021-09-11 MED ORDER — GADOBENATE DIMEGLUMINE 529 MG/ML IV SOLN
9.0000 mL | Freq: Once | INTRAVENOUS | Status: AC | PRN
  Administered 2021-09-11: 9 mL via INTRAVENOUS

## 2021-09-23 DIAGNOSIS — M810 Age-related osteoporosis without current pathological fracture: Secondary | ICD-10-CM | POA: Diagnosis not present

## 2022-01-04 DIAGNOSIS — N1831 Chronic kidney disease, stage 3a: Secondary | ICD-10-CM | POA: Diagnosis not present

## 2022-01-04 DIAGNOSIS — K862 Cyst of pancreas: Secondary | ICD-10-CM | POA: Diagnosis not present

## 2022-01-04 DIAGNOSIS — M810 Age-related osteoporosis without current pathological fracture: Secondary | ICD-10-CM | POA: Diagnosis not present

## 2022-01-04 DIAGNOSIS — I700 Atherosclerosis of aorta: Secondary | ICD-10-CM | POA: Diagnosis not present

## 2022-01-04 DIAGNOSIS — E7800 Pure hypercholesterolemia, unspecified: Secondary | ICD-10-CM | POA: Diagnosis not present

## 2022-01-04 DIAGNOSIS — I129 Hypertensive chronic kidney disease with stage 1 through stage 4 chronic kidney disease, or unspecified chronic kidney disease: Secondary | ICD-10-CM | POA: Diagnosis not present

## 2022-01-04 DIAGNOSIS — Z23 Encounter for immunization: Secondary | ICD-10-CM | POA: Diagnosis not present

## 2022-01-06 ENCOUNTER — Other Ambulatory Visit: Payer: Self-pay | Admitting: Family Medicine

## 2022-01-06 DIAGNOSIS — K862 Cyst of pancreas: Secondary | ICD-10-CM

## 2022-02-01 DIAGNOSIS — Z1231 Encounter for screening mammogram for malignant neoplasm of breast: Secondary | ICD-10-CM | POA: Diagnosis not present

## 2022-02-09 DIAGNOSIS — Z681 Body mass index (BMI) 19 or less, adult: Secondary | ICD-10-CM | POA: Diagnosis not present

## 2022-02-09 DIAGNOSIS — Z0000 Encounter for general adult medical examination without abnormal findings: Secondary | ICD-10-CM | POA: Diagnosis not present

## 2022-02-18 DIAGNOSIS — Z1211 Encounter for screening for malignant neoplasm of colon: Secondary | ICD-10-CM | POA: Diagnosis not present

## 2022-05-07 ENCOUNTER — Other Ambulatory Visit: Payer: Self-pay | Admitting: Family Medicine

## 2022-05-07 DIAGNOSIS — K862 Cyst of pancreas: Secondary | ICD-10-CM

## 2022-05-14 DIAGNOSIS — M810 Age-related osteoporosis without current pathological fracture: Secondary | ICD-10-CM | POA: Diagnosis not present

## 2022-06-06 ENCOUNTER — Ambulatory Visit
Admission: RE | Admit: 2022-06-06 | Discharge: 2022-06-06 | Disposition: A | Discharge status: Home / Self Care | Payer: Medicare PPO | Source: Ambulatory Visit | Attending: Family Medicine | Admitting: Family Medicine

## 2022-06-06 DIAGNOSIS — K862 Cyst of pancreas: Secondary | ICD-10-CM | POA: Diagnosis not present

## 2022-06-06 MED ORDER — GADOPICLENOL 0.5 MMOL/ML IV SOLN
5.0000 mL | Freq: Once | INTRAVENOUS | Status: AC | PRN
  Administered 2022-06-06: 5 mL via INTRAVENOUS

## 2022-11-19 DIAGNOSIS — R059 Cough, unspecified: Secondary | ICD-10-CM | POA: Diagnosis not present

## 2022-11-19 DIAGNOSIS — R509 Fever, unspecified: Secondary | ICD-10-CM | POA: Diagnosis not present

## 2022-11-19 DIAGNOSIS — R918 Other nonspecific abnormal finding of lung field: Secondary | ICD-10-CM | POA: Diagnosis not present

## 2022-11-23 DIAGNOSIS — J189 Pneumonia, unspecified organism: Secondary | ICD-10-CM | POA: Diagnosis not present

## 2022-11-23 DIAGNOSIS — Z681 Body mass index (BMI) 19 or less, adult: Secondary | ICD-10-CM | POA: Diagnosis not present

## 2022-11-25 ENCOUNTER — Other Ambulatory Visit: Payer: Self-pay | Admitting: Family Medicine

## 2022-11-25 DIAGNOSIS — N2889 Other specified disorders of kidney and ureter: Secondary | ICD-10-CM

## 2022-12-02 ENCOUNTER — Ambulatory Visit
Admission: RE | Admit: 2022-12-02 | Discharge: 2022-12-02 | Disposition: A | Discharge status: Home / Self Care | Payer: Medicare PPO | Source: Ambulatory Visit | Attending: Family Medicine | Admitting: Family Medicine

## 2022-12-02 DIAGNOSIS — N2889 Other specified disorders of kidney and ureter: Secondary | ICD-10-CM | POA: Diagnosis not present

## 2022-12-08 DIAGNOSIS — M810 Age-related osteoporosis without current pathological fracture: Secondary | ICD-10-CM | POA: Diagnosis not present

## 2023-01-31 DIAGNOSIS — Z23 Encounter for immunization: Secondary | ICD-10-CM | POA: Diagnosis not present

## 2023-01-31 DIAGNOSIS — N281 Cyst of kidney, acquired: Secondary | ICD-10-CM | POA: Diagnosis not present

## 2023-01-31 DIAGNOSIS — Z09 Encounter for follow-up examination after completed treatment for conditions other than malignant neoplasm: Secondary | ICD-10-CM | POA: Diagnosis not present

## 2023-01-31 DIAGNOSIS — Z8701 Personal history of pneumonia (recurrent): Secondary | ICD-10-CM | POA: Diagnosis not present

## 2023-01-31 DIAGNOSIS — I700 Atherosclerosis of aorta: Secondary | ICD-10-CM | POA: Diagnosis not present

## 2023-01-31 DIAGNOSIS — K862 Cyst of pancreas: Secondary | ICD-10-CM | POA: Diagnosis not present

## 2023-01-31 DIAGNOSIS — N1831 Chronic kidney disease, stage 3a: Secondary | ICD-10-CM | POA: Diagnosis not present

## 2023-01-31 DIAGNOSIS — I10 Essential (primary) hypertension: Secondary | ICD-10-CM | POA: Diagnosis not present

## 2023-01-31 DIAGNOSIS — N1832 Chronic kidney disease, stage 3b: Secondary | ICD-10-CM | POA: Diagnosis not present

## 2023-01-31 DIAGNOSIS — M810 Age-related osteoporosis without current pathological fracture: Secondary | ICD-10-CM | POA: Diagnosis not present

## 2023-01-31 DIAGNOSIS — I129 Hypertensive chronic kidney disease with stage 1 through stage 4 chronic kidney disease, or unspecified chronic kidney disease: Secondary | ICD-10-CM | POA: Diagnosis not present

## 2023-01-31 DIAGNOSIS — Z681 Body mass index (BMI) 19 or less, adult: Secondary | ICD-10-CM | POA: Diagnosis not present

## 2023-01-31 DIAGNOSIS — S2242XA Multiple fractures of ribs, left side, initial encounter for closed fracture: Secondary | ICD-10-CM | POA: Diagnosis not present

## 2023-02-17 DIAGNOSIS — Z681 Body mass index (BMI) 19 or less, adult: Secondary | ICD-10-CM | POA: Diagnosis not present

## 2023-02-17 DIAGNOSIS — Z0000 Encounter for general adult medical examination without abnormal findings: Secondary | ICD-10-CM | POA: Diagnosis not present

## 2023-02-17 DIAGNOSIS — Z1231 Encounter for screening mammogram for malignant neoplasm of breast: Secondary | ICD-10-CM | POA: Diagnosis not present

## 2023-02-17 DIAGNOSIS — Z1331 Encounter for screening for depression: Secondary | ICD-10-CM | POA: Diagnosis not present

## 2023-02-24 DIAGNOSIS — Z23 Encounter for immunization: Secondary | ICD-10-CM | POA: Diagnosis not present

## 2023-05-30 ENCOUNTER — Other Ambulatory Visit: Payer: Self-pay | Admitting: Family Medicine

## 2023-05-30 DIAGNOSIS — K862 Cyst of pancreas: Secondary | ICD-10-CM

## 2023-06-13 DIAGNOSIS — M810 Age-related osteoporosis without current pathological fracture: Secondary | ICD-10-CM | POA: Diagnosis not present

## 2024-01-05 DIAGNOSIS — M810 Age-related osteoporosis without current pathological fracture: Secondary | ICD-10-CM | POA: Diagnosis not present

## 2024-02-23 DIAGNOSIS — Z1231 Encounter for screening mammogram for malignant neoplasm of breast: Secondary | ICD-10-CM | POA: Diagnosis not present
